# Patient Record
Sex: Male | Born: 1990 | Race: White | Hispanic: No | Marital: Single | Smoking: Never smoker
Health system: Southern US, Community
[De-identification: ages and names within clinical notes are randomized; demographics above are authoritative.]

## PROBLEM LIST (undated history)

## (undated) DIAGNOSIS — F32A Depression, unspecified: Secondary | ICD-10-CM

## (undated) DIAGNOSIS — R319 Hematuria, unspecified: Secondary | ICD-10-CM

## (undated) DIAGNOSIS — F319 Bipolar disorder, unspecified: Secondary | ICD-10-CM

## (undated) DIAGNOSIS — Z960 Presence of urogenital implants: Secondary | ICD-10-CM

## (undated) DIAGNOSIS — Z87442 Personal history of urinary calculi: Secondary | ICD-10-CM

## (undated) DIAGNOSIS — F419 Anxiety disorder, unspecified: Secondary | ICD-10-CM

## (undated) DIAGNOSIS — R110 Nausea: Secondary | ICD-10-CM

## (undated) DIAGNOSIS — F909 Attention-deficit hyperactivity disorder, unspecified type: Secondary | ICD-10-CM

## (undated) DIAGNOSIS — R300 Dysuria: Secondary | ICD-10-CM

## (undated) DIAGNOSIS — F329 Major depressive disorder, single episode, unspecified: Secondary | ICD-10-CM

---
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
## 2013-12-26 DIAGNOSIS — Z87442 Personal history of urinary calculi: Secondary | ICD-10-CM

## 2013-12-26 HISTORY — DX: Personal history of urinary calculi: Z87.442

## 2014-12-14 ENCOUNTER — Emergency Department (HOSPITAL_COMMUNITY)
Admission: EM | Admit: 2014-12-14 | Discharge: 2014-12-14 | Disposition: A | Discharge status: Home / Self Care | Payer: Medicaid Other | Attending: Emergency Medicine | Admitting: Emergency Medicine

## 2014-12-14 ENCOUNTER — Encounter (HOSPITAL_COMMUNITY): Payer: Self-pay | Admitting: Emergency Medicine

## 2014-12-14 DIAGNOSIS — Z87442 Personal history of urinary calculi: Secondary | ICD-10-CM | POA: Insufficient documentation

## 2014-12-14 DIAGNOSIS — Z79899 Other long term (current) drug therapy: Secondary | ICD-10-CM | POA: Diagnosis not present

## 2014-12-14 DIAGNOSIS — R1012 Left upper quadrant pain: Secondary | ICD-10-CM | POA: Diagnosis present

## 2014-12-14 DIAGNOSIS — R319 Hematuria, unspecified: Secondary | ICD-10-CM | POA: Insufficient documentation

## 2014-12-14 LAB — URINALYSIS, ROUTINE W REFLEX MICROSCOPIC
BILIRUBIN URINE: NEGATIVE
Glucose, UA: NEGATIVE mg/dL
Ketones, ur: NEGATIVE mg/dL
Leukocytes, UA: NEGATIVE
Nitrite: NEGATIVE
Protein, ur: 30 mg/dL — AB
Specific Gravity, Urine: 1.025 (ref 1.005–1.030)
UROBILINOGEN UA: 0.2 mg/dL (ref 0.0–1.0)
pH: 6 (ref 5.0–8.0)

## 2014-12-14 LAB — URINE MICROSCOPIC-ADD ON

## 2014-12-14 MED ORDER — SODIUM CHLORIDE 0.9 % IV SOLN
Freq: Once | INTRAVENOUS | Status: AC
  Administered 2014-12-14: 11:00:00 via INTRAVENOUS

## 2014-12-14 MED ORDER — ONDANSETRON 4 MG PO TBDP: 4.0000 mg | ORAL_TABLET | Freq: Three times a day (TID) | ORAL | Status: DC | PRN

## 2014-12-14 MED ORDER — ONDANSETRON HCL 4 MG/2ML IJ SOLN
4.0000 mg | Freq: Once | INTRAMUSCULAR | Status: AC
  Administered 2014-12-14: 4 mg via INTRAVENOUS
  Filled 2014-12-14: qty 2

## 2014-12-14 MED ORDER — OXYCODONE-ACETAMINOPHEN 5-325 MG PO TABS: 2.0000 | ORAL_TABLET | ORAL | Status: DC | PRN

## 2014-12-14 MED ORDER — HYDROMORPHONE HCL 1 MG/ML IJ SOLN
1.0000 mg | Freq: Once | INTRAMUSCULAR | Status: AC
  Administered 2014-12-14: 1 mg via INTRAVENOUS
  Filled 2014-12-14: qty 1

## 2014-12-14 NOTE — ED Notes (Signed)
Took Hydrocodone 7.5mg  at 0830 this morning [facility] helped with pain very little.

## 2014-12-14 NOTE — ED Notes (Signed)
I gave the patient a cup of ice water per nurse Michelle. 

## 2014-12-14 NOTE — ED Notes (Signed)
Two days ago urinating blood, [facility] Told has a 9mm kidney stone that may not ever move. Last night started having pain in lower left abdomen. Denies vomiting. Oral mucosa very dry, reports he ate and drank normally until this morning, but has not had anything today due to feeling nauseated and the pain.

## 2014-12-14 NOTE — ED Notes (Signed)
Urinal given

## 2014-12-14 NOTE — Discharge Instructions (Signed)

## 2014-12-14 NOTE — ED Provider Notes (Signed)
CSN: [id number]     Arrival date & time 12/14/14  [id number] History   First MD Initiated Contact with Patient 12/14/14 [phone]     Chief Complaint  Patient presents with  . Flank Pain     (Consider location/radiation/quality/duration/timing/severity/associated sxs/prior Treatment) Patient is a 23 y.o. male presenting with abdominal pain. The history is provided by the patient. No language interpreter was used.  Abdominal Pain Pain location:  L flank Pain quality: aching and stabbing   Pain radiates to:  LUQ Pain severity:  Moderate Onset quality:  Gradual Duration:  1 day Timing:  Constant Progression:  Worsening Chronicity:  New Context: not recent illness   Relieved by:  Nothing Worsened by:  Nothing tried Ineffective treatments:  None tried Associated symptoms: nausea   Associated symptoms: no cough    Pt reports he was seen [facility] days ago and diagnosed with a kidney stone.  Pt reports he was told that he has a 9mm stone in his kidney. Pt reports he felt sick again this morning.  Pt has not ate or drnak anything today History reviewed. No pertinent past medical history. History reviewed. No pertinent past surgical history. History reviewed. No pertinent family history. History  Substance Use Topics  . Smoking status: Never Smoker   . Smokeless tobacco: Not on file  . Alcohol Use: No    Review of Systems  Respiratory: Negative for cough.   Gastrointestinal: Positive for nausea and abdominal pain.  All other systems reviewed and are negative.     Allergies  Review of patient's allergies indicates no known allergies.  Home Medications   Prior to Admission medications   Medication Sig Start Date End Date Taking? Authorizing Provider  busPIRone (BUSPAR) 15 MG tablet Take 15 mg by mouth 2 (two) times daily. 11/22/14  Yes Historical Provider, MD  citalopram (CELEXA) 20 MG tablet Take 20 mg by mouth daily. 11/27/14  Yes Historical Provider, MD  FOCALIN XR 5  MG 24 hr capsule Take 5 mg by mouth daily. 10/25/14  Yes Historical Provider, MD  lamoTRIgine (LAMICTAL) 100 MG tablet Take 100 mg by mouth at bedtime. 09/07/14  Yes Historical Provider, MD   BP 106/68 mmHg  Pulse 97  Temp(Src) 97.8 F (36.6 C) (Oral)  Resp 16  Ht 5\' 8"  (1.727 m)  Wt 110 lb (49.896 kg)  BMI 16.73 kg/m2  SpO2 100% Physical Exam  Constitutional: He is oriented to person, place, and time. He appears well-developed and well-nourished.  HENT:  Head: Normocephalic.  Eyes: EOM are normal. Pupils are equal, round, and reactive to light.  Neck: Normal range of motion.  Cardiovascular: Normal rate and normal heart sounds.   Pulmonary/Chest: Effort normal.  Abdominal: Soft. He exhibits no distension.  Musculoskeletal: Normal range of motion.  Neurological: He is alert and oriented to person, place, and time.  Skin: Skin is warm.  Psychiatric: He has a normal mood and affect.  Nursing note and vitals reviewed.   ED Course  Procedures (including critical care time) Labs Review Labs Reviewed  URINALYSIS, ROUTINE W REFLEX MICROSCOPIC - Abnormal; Notable for the following:    Color, Urine AMBER (*)    APPearance TURBID (*)    Hgb urine dipstick LARGE (*)    Protein, ur 30 (*)    All other components within normal limits  URINE MICROSCOPIC-ADD ON - Abnormal; Notable for the following:    Squamous Epithelial / LPF FEW (*)    Crystals CA OXALATE CRYSTALS (*)  All other components within normal limits    Imaging Review No results found.   EKG Interpretation None      MDM   Final diagnoses:  Hematuria    [facility]   Pt has a 9mm renal stone.  No obstruction no hydro.  Pt feels better with Iv fluids and pain medication.  Pt advised to schedule to see Urology for evaluation.  Pt given rx for percocet and zofran odt.   Lonia SkinnerLeslie K PopeSofia, PA-C 12/14/14 1241  Rolland PorterMark James, MD 12/15/14 [phone]

## 2014-12-15 ENCOUNTER — Encounter (HOSPITAL_COMMUNITY)
Admission: RE | Disposition: A | Discharge status: Home / Self Care | Payer: Self-pay | Source: Ambulatory Visit | Attending: Urology

## 2014-12-15 ENCOUNTER — Encounter (HOSPITAL_COMMUNITY): Payer: Self-pay | Admitting: *Deleted

## 2014-12-15 ENCOUNTER — Ambulatory Visit (HOSPITAL_COMMUNITY): Payer: Medicaid Other

## 2014-12-15 ENCOUNTER — Ambulatory Visit (HOSPITAL_COMMUNITY)
Admission: RE | Admit: 2014-12-15 | Discharge: 2014-12-15 | Disposition: A | Discharge status: Home / Self Care | Payer: Medicaid Other | Source: Ambulatory Visit | Attending: Urology | Admitting: Urology

## 2014-12-15 ENCOUNTER — Ambulatory Visit: Admit: 2014-12-15 | Payer: Self-pay | Admitting: Urology

## 2014-12-15 ENCOUNTER — Other Ambulatory Visit: Payer: Self-pay | Admitting: Urology

## 2014-12-15 ENCOUNTER — Ambulatory Visit (HOSPITAL_COMMUNITY): Payer: Medicaid Other | Admitting: Anesthesiology

## 2014-12-15 DIAGNOSIS — R509 Fever, unspecified: Secondary | ICD-10-CM | POA: Insufficient documentation

## 2014-12-15 DIAGNOSIS — F319 Bipolar disorder, unspecified: Secondary | ICD-10-CM | POA: Diagnosis not present

## 2014-12-15 DIAGNOSIS — N201 Calculus of ureter: Secondary | ICD-10-CM | POA: Insufficient documentation

## 2014-12-15 DIAGNOSIS — Z841 Family history of disorders of kidney and ureter: Secondary | ICD-10-CM | POA: Diagnosis not present

## 2014-12-15 DIAGNOSIS — N202 Calculus of kidney with calculus of ureter: Secondary | ICD-10-CM | POA: Diagnosis present

## 2014-12-15 DIAGNOSIS — N200 Calculus of kidney: Secondary | ICD-10-CM

## 2014-12-15 HISTORY — DX: Major depressive disorder, single episode, unspecified: F32.9

## 2014-12-15 HISTORY — DX: Depression, unspecified: F32.A

## 2014-12-15 HISTORY — PX: CYSTOSCOPY W/ URETERAL STENT PLACEMENT: SHX1429

## 2014-12-15 HISTORY — DX: Bipolar disorder, unspecified: F31.9

## 2014-12-15 HISTORY — PX: CYSTOSCOPY WITH RETROGRADE PYELOGRAM, URETEROSCOPY AND STENT PLACEMENT: SHX5789

## 2014-12-15 HISTORY — DX: Anxiety disorder, unspecified: F41.9

## 2014-12-15 HISTORY — DX: Personal history of urinary calculi: Z87.442

## 2014-12-15 SURGERY — CYSTOSCOPY, WITH STENT INSERTION
Anesthesia: Choice | Laterality: Left

## 2014-12-15 SURGERY — LITHOTRIPSY, ESWL
Anesthesia: LOCAL

## 2014-12-15 SURGERY — CYSTOURETEROSCOPY, WITH RETROGRADE PYELOGRAM AND STENT INSERTION
Anesthesia: General | Site: Ureter | Laterality: Left

## 2014-12-15 MED ORDER — FENTANYL CITRATE 0.05 MG/ML IJ SOLN: 25.0000 ug | INTRAMUSCULAR | Status: DC | PRN

## 2014-12-15 MED ORDER — OXYCODONE-ACETAMINOPHEN 5-325 MG PO TABS: 1.0000 | ORAL_TABLET | ORAL | Status: DC | PRN

## 2014-12-15 MED ORDER — DIAZEPAM 5 MG PO TABS
10.0000 mg | ORAL_TABLET | ORAL | Status: AC
  Administered 2014-12-15: 10 mg via ORAL
  Filled 2014-12-15: qty 2

## 2014-12-15 MED ORDER — ONDANSETRON HCL 4 MG/2ML IJ SOLN
INTRAMUSCULAR | Status: AC
  Filled 2014-12-15: qty 2

## 2014-12-15 MED ORDER — LIDOCAINE HCL 2 % EX GEL
CUTANEOUS | Status: DC | PRN
  Administered 2014-12-15: 1 via URETHRAL

## 2014-12-15 MED ORDER — DEXAMETHASONE SODIUM PHOSPHATE 10 MG/ML IJ SOLN
INTRAMUSCULAR | Status: AC
  Filled 2014-12-15: qty 1

## 2014-12-15 MED ORDER — DIPHENHYDRAMINE HCL 25 MG PO CAPS
25.0000 mg | ORAL_CAPSULE | ORAL | Status: AC
  Administered 2014-12-15: 25 mg via ORAL
  Filled 2014-12-15: qty 1

## 2014-12-15 MED ORDER — METOCLOPRAMIDE HCL 5 MG/ML IJ SOLN
INTRAMUSCULAR | Status: DC | PRN
  Administered 2014-12-15: 10 mg via INTRAVENOUS

## 2014-12-15 MED ORDER — METOCLOPRAMIDE HCL 5 MG/ML IJ SOLN
INTRAMUSCULAR | Status: AC
  Filled 2014-12-15: qty 2

## 2014-12-15 MED ORDER — LIDOCAINE HCL 2 % EX GEL
CUTANEOUS | Status: AC
  Filled 2014-12-15: qty 10

## 2014-12-15 MED ORDER — DEXAMETHASONE SODIUM PHOSPHATE 10 MG/ML IJ SOLN
INTRAMUSCULAR | Status: DC | PRN
  Administered 2014-12-15: 10 mg via INTRAVENOUS

## 2014-12-15 MED ORDER — BELLADONNA ALKALOIDS-OPIUM 16.2-60 MG RE SUPP
RECTAL | Status: DC | PRN
  Administered 2014-12-15: 1 via RECTAL

## 2014-12-15 MED ORDER — CIPROFLOXACIN HCL 500 MG PO TABS
500.0000 mg | ORAL_TABLET | Freq: Two times a day (BID) | ORAL | Status: DC
Start: 2014-12-15 — End: 2017-01-05

## 2014-12-15 MED ORDER — KETOROLAC TROMETHAMINE 30 MG/ML IJ SOLN
INTRAMUSCULAR | Status: AC
  Filled 2014-12-15: qty 1

## 2014-12-15 MED ORDER — KETOROLAC TROMETHAMINE 30 MG/ML IJ SOLN
INTRAMUSCULAR | Status: DC | PRN
  Administered 2014-12-15: 30 mg via INTRAVENOUS

## 2014-12-15 MED ORDER — FENTANYL CITRATE 0.05 MG/ML IJ SOLN
INTRAMUSCULAR | Status: DC | PRN
  Administered 2014-12-15 (×2): 50 ug via INTRAVENOUS

## 2014-12-15 MED ORDER — DEXTROSE-NACL 5-0.45 % IV SOLN
INTRAVENOUS | Status: DC
  Administered 2014-12-15: 16:00:00 via INTRAVENOUS

## 2014-12-15 MED ORDER — MIDAZOLAM HCL 5 MG/5ML IJ SOLN
INTRAMUSCULAR | Status: DC | PRN
  Administered 2014-12-15: 2 mg via INTRAVENOUS

## 2014-12-15 MED ORDER — PROPOFOL 10 MG/ML IV BOLUS
INTRAVENOUS | Status: AC
  Filled 2014-12-15: qty 20

## 2014-12-15 MED ORDER — ONDANSETRON HCL 4 MG/2ML IJ SOLN
INTRAMUSCULAR | Status: DC | PRN
  Administered 2014-12-15: 4 mg via INTRAVENOUS

## 2014-12-15 MED ORDER — BELLADONNA ALKALOIDS-OPIUM 16.2-60 MG RE SUPP
RECTAL | Status: AC
  Filled 2014-12-15: qty 1

## 2014-12-15 MED ORDER — PROPOFOL 10 MG/ML IV BOLUS
INTRAVENOUS | Status: DC | PRN
  Administered 2014-12-15: 150 mg via INTRAVENOUS

## 2014-12-15 MED ORDER — FENTANYL CITRATE 0.05 MG/ML IJ SOLN
INTRAMUSCULAR | Status: AC
  Filled 2014-12-15: qty 2

## 2014-12-15 MED ORDER — STERILE WATER FOR IRRIGATION IR SOLN
Status: DC | PRN
  Administered 2014-12-15: 3000 mL

## 2014-12-15 MED ORDER — LACTATED RINGERS IV SOLN: INTRAVENOUS | Status: DC

## 2014-12-15 MED ORDER — MIDAZOLAM HCL 2 MG/2ML IJ SOLN
INTRAMUSCULAR | Status: AC
  Filled 2014-12-15: qty 2

## 2014-12-15 MED ORDER — PROMETHAZINE HCL 25 MG/ML IJ SOLN: 6.2500 mg | INTRAMUSCULAR | Status: DC | PRN

## 2014-12-15 MED ORDER — ONDANSETRON HCL 4 MG/2ML IJ SOLN
INTRAMUSCULAR | Status: AC
  Filled 2014-12-15: qty 4

## 2014-12-15 MED ORDER — PHENAZOPYRIDINE HCL 200 MG PO TABS: 200.0000 mg | ORAL_TABLET | Freq: Three times a day (TID) | ORAL | Status: DC | PRN

## 2014-12-15 MED ORDER — CIPROFLOXACIN HCL 500 MG PO TABS
500.0000 mg | ORAL_TABLET | ORAL | Status: AC
  Administered 2014-12-15: 500 mg via ORAL
  Filled 2014-12-15: qty 1

## 2014-12-15 MED ORDER — LACTATED RINGERS IV SOLN
INTRAVENOUS | Status: DC | PRN
  Administered 2014-12-15: 21:00:00 via INTRAVENOUS

## 2014-12-15 SURGICAL SUPPLY — 11 items
BAG URO CATCHER STRL LF (DRAPE) ×3 IMPLANT
CATH URET 5FR 28IN OPEN ENDED (CATHETERS) IMPLANT
CLOTH BEACON ORANGE TIMEOUT ST (SAFETY) ×3 IMPLANT
DRAPE CAMERA CLOSED 9X96 (DRAPES) ×3 IMPLANT
GLOVE SURG SS PI 8.0 STRL IVOR (GLOVE) IMPLANT
GOWN STRL REUS W/TWL XL LVL3 (GOWN DISPOSABLE) ×3 IMPLANT
MANIFOLD NEPTUNE II (INSTRUMENTS) ×3 IMPLANT
PACK CYSTO (CUSTOM PROCEDURE TRAY) ×3 IMPLANT
STENT PERCUFLEX 4.8FRX26 (STENTS) ×3 IMPLANT
TUBING CONNECTING 10 (TUBING) ×2 IMPLANT
TUBING CONNECTING 10' (TUBING) ×1

## 2014-12-15 NOTE — Brief Op Note (Signed)
12/15/2014  9:15 PM  PATIENT:  Douglas Bailey  23 y.o. male  PRE-OPERATIVE DIAGNOSIS:  LEFT PROXIMAL URETREAL STONE  POST-OPERATIVE DIAGNOSIS:  left proximal ureteral stone  PROCEDURE:  Procedure(s): CYSTOSCOPY STENT PLACEMENT left ureter (Left)  SURGEON:  Surgeon(s) and Role:    * Anner CreteJohn J Donne Baley, MD - Primary  PHYSICIAN ASSISTANT:   ASSISTANTS: none   ANESTHESIA:   general  EBL:     BLOOD ADMINISTERED:none  DRAINS: 4.8 x 3126fr JJ stent   LOCAL MEDICATIONS USED:  LIDOCAINE  and Amount: 10 ml jelly  SPECIMEN:  No Specimen  DISPOSITION OF SPECIMEN:  N/A  COUNTS:  YES  TOURNIQUET:  * No tourniquets in log *  DICTATION: .Other Dictation: Dictation Number [id number]  PLAN OF CARE: Discharge to home after PACU  PATIENT DISPOSITION:  PACU - hemodynamically stable.   Delay start of Pharmacological VTE agent (>24hrs) due to surgical blood loss or risk of bleeding: not applicable

## 2014-12-15 NOTE — H&P (Signed)
Active Problems Problems  1. Renal calculus, left (N20.0)  History of Present Illness Douglas Bailey is a 23 yo WM who is sent with a 9mm left renal stone.  He was seen in the [facility] 2 days ago for left flank pain. The CT showed a 9mm renal pelvic stone without obstruction. The pain abated with medication but he is hurting again. He has had no prior stones. He has had no GU surgery or UTIs.  He has had gross hematuria and some straining to void. The pain is severe.  His stone was 700+ HU on CT but he has uric acid crystals in the urine today.   Past Medical History Problems  1. History of Anxiety (F41.9) 2. History of Bipolar disorder (F31.9) 3. History of depression (Z86.59)  Surgical History Problems  1. History of No Surgical Problems  Current Meds 1. BusPIRone HCl - 15 MG Oral Tablet;  Therapy: (Recorded:21Dec2015) to Recorded 2. Citalopram Hydrobromide 20 MG Oral Tablet;  Therapy: (Recorded:21Dec2015) to Recorded 3. LaMICtal 100 MG Oral Tablet;  Therapy: (Recorded:21Dec2015) to Recorded 4. Ondansetron 4 MG Oral Tablet Dispersible;  Therapy: (Recorded:21Dec2015) to Recorded 5. Oxycodone-Acetaminophen 5-325 MG Oral Tablet;  Therapy: (Recorded:21Dec2015) to Recorded  Allergies Medication  1. No Known Drug Allergies  Family History Problems  1. Family history of kidney stones (Z84.1) : Mother, Father 2. Family history of urinary tract infection (Z84.2) : Mother  Social History Problems    Denied: History of Alcohol use   Never a smoker   Occupation   not employed   Single  Review of Systems Genitourinary, constitutional, skin, eye, otolaryngeal, hematologic/lymphatic, cardiovascular, pulmonary, endocrine, musculoskeletal, gastrointestinal, neurological and psychiatric system(s) were reviewed and pertinent findings if present are noted and are otherwise negative.  Genitourinary: urinary frequency, dysuria, hematuria and initiating urination requires straining.   Gastrointestinal: nausea and flank pain.  Constitutional: feeling tired (fatigue).  Psychiatric: anxiety and depression.    Vitals Vital Signs [Data Includes: Last 1 Day]  Recorded: 21Dec2015 12:25PM  Height: 5 ft 8 in Weight: 110 lb  BMI Calculated: 16.73 BSA Calculated: 1.59 Blood Pressure: 107 / 72 Temperature: 97.8 F Heart Rate: 75  Physical Exam Constitutional: Well nourished and well developed . In acute distress. With pain.  ENT:. The ears and nose are normal in appearance. 2 lip rings.  Neck: The appearance of the neck is normal and no neck mass is present.  Pulmonary: No respiratory distress and normal respiratory rhythm and effort.  Cardiovascular: Heart rate and rhythm are normal . No peripheral edema.  Abdomen: No masses are palpated. Severe tenderness in the LLQ is present. severe left CVA tenderness. No hernias are palpable. No hepatosplenomegaly noted.  Lymphatics: The posterior cervical and supraclavicular nodes are not enlarged or tender.  Skin: Normal skin turgor, no visible rash and no visible skin lesions.  Neuro/Psych:. Mood and affect are appropriate.    Results/Data Urine [Data Includes: Last 1 Day]   21Dec2015  COLOR AMBER   APPEARANCE CLOUDY   SPECIFIC GRAVITY 1.020   pH 6.0   GLUCOSE NEG mg/dL  BILIRUBIN NEG   KETONE NEG mg/dL  BLOOD LARGE   PROTEIN 30 mg/dL  UROBILINOGEN 0.2 mg/dL  NITRITE NEG   LEUKOCYTE ESTERASE NEG   SQUAMOUS EPITHELIAL/HPF NONE SEEN   WBC NONE SEEN WBC/hpf  RBC TNTC RBC/hpf  BACTERIA NONE SEEN   CRYSTALS Uric Acid crystals noted   CASTS NONE SEEN    Old records or history reviewed: I have reviewed the ER   records.  The following images/tracing/specimen were independentlyVa Medical Center - Batavi65[facility] GrapeviOZH:YLuciana[place] KoreSaAurea [id number] - Harlingen9Sterlington Rehabilitation HospitalHarlLadon30iaLester C639Id[facility] P23Naples Eye Surgery CentOZH:YLucianaK[place] KoreSaAurea [id number]9Proliance Highlands Surgery CenterHarlAnders49onLester CChristus St. Frances Cabrini Hospita25Grande Ronde HospitOZH:YLuciana[place] KoreSaAurea Gr2846082Va [facility]. [facility]9Wiregrass Medical CenterHarlVandeme18reLester C339Obie DreCedar Park Surgery CeFort Myers Surgery Cente20Advanced Eye Surgery Center OZH:YLucianaK[place] KoreSaAurea [id number] [place]9Dearborn Surgery Center LLC Dba Dearborn Surgery CenterHarlSigourn6eyLester CCarolina Mountain Gastroenterology Endoscopy Center LL43Ridge Lake Asc LOZH:YLucianaKent[place] KoreSaAurea Gr2842282The Medical Center At Caverna9Blueridge Vista Health And WellnessHarlUpper Bear Cre64ekLesAdventist Health Simi Valle71St Joseph Health CentOZH:YLucianaK[place] KoreSaAurea Gr284282Chi Health St. Elizabeth9Thomas HospitalHarlVero Beach Sou38thEye Surgicenter Of New Jerse77Landmark Hospital Of Southwest FloriOZH:YLucianaK[place] KoreSaAurea Gr2843282Northern Michigan Surgical Suites9Watsonville Community HospitalHarlJunction COroville Hospita71Royal Oaks HospitOZH:YLucianaKent[place] KoreSaAurea Gr2848682North[facility]9Laser Surgery CtrHarlGran20byLester C853Obie DChristus Dubuis Hospital Of Beaumon72Great River Medical CentOZH:YLucianaK[place] KoreSaAurea Gr284382[facility] [place]9Select Specialty Hospital - [facility] BoardmanHarlLake Providen77ceLester C66Northwest Spine And Laser Surgery Center LL49Wellspan Ephrata Community HospitOZH:YLuciana[place] KoreSaAurea Gr284282Centra Lynch[facility] Hospital9Pioneer Memorial HospitalHarlNew Lla47noLKindred Hospital Bay Are73Lawnwood Pavilion - Psychiatric HospitOZH:YLucianaKent[place] KoreSaAurea Gr2842082Wilming[facility] Va [facility] Center9South Austin Surgicenter LLCHarlAllenda76leLester Northern Light A R Gould Hospita64Wayne Memorial HospitOZH:YLucianaK[place] KoreSaAurea Gr2849682Silver Lake Medical Center-Ingleside Campus9Providence Surgery CenterHarlLake Ci3tyLester C56Department Of Veterans Affairs Medical Cente26Hosp San Carlos BorromOZH:YLuciana[place] KoreSaAurea Gr2846582C[facility]9Surgery Center Of Chesapeake LLCHarlParadi25seLester C33Obie DreRiverside Behavioral Health KentuckyeCoConnecticut Childbirth & Women'S Cente66Surgery Center At St Vincent LLC Dba East Pavilion Surgery CentOZH:YLuciana[place] KoreSaAurea Gr2843582Nyu Hospitals Center9Kindred Hospital Sugar LandHarlHolden Bea11chLester C395Obie Mcleod Medical Center-Darlingto70Kindred Hospital Baldwin PaOZH:YLucianaK[place] KoreSaAurea Gr2842482Kansas Spine Hospital LLC9Encompass Health Rehabilitation Hospital Of AbileneHarlLittle Eag74leRoseland Community Hospita65Orlando Outpatient Surgery CentOZH:YLuciana[place] KoreSaAurea Gr2845882Divine Savior Hlthcare9Missouri Baptist Hospital Of SullivanHarlSal8ixLester C457ObGlobal Microsurgical Center LL51United Hospital CentOZH:YLucianaKent[place] KoreSaAurea Gr284182Uchealth Grandview Hospital9Millinocket Regional HospitalHarlCavali66erLester C6Washington Gastroenterolog2Wamego Health CentOZH:YLucianaKent[place] KoreSaAurea Gr2842182Kindred [facility] - [place]9Hillside Endoscopy Center LLCHarlPowers La38keLester CQuad City Ambulatory Surgery Center LL20Nhpe LLC Dba New [place] Park EndoscoOZH:YLuciana[place] KoreSaAurea Gr2844382Cleburne Surgical Center LLP9Shannon Medical Center St Johns CampusHarlTenna10ntLesteChesterton Surgery Center LL46Endoscopy Center Of Pennsylania HospitOZH:YLuciana[place] KoreSaAurea Gr2846882St. Mary'S [facility] Center9Adventhealth HendersonvilleHarlBelvide11reLesterCheyenne Regional Medical Cente35Kindred Hospital El PaOZH:YLuciana[place] KoreSaAurea Gr2847082Dupage Eye Surgery Center LLC9Wasatch Front Surgery Center LLCHarlParco21alLester C3695OBaylor Scott White Surgicare Plan5Oaklawn Psychiatric Center IOZH:YLuciana[place] KoreSaAurea Gr2848482Bayfront Health Port Charlotte9Indiana Regional Medical CenterHarlNels73onLester C3214Obie DreColumbia GastrointestiNorth Valley Hospita31Avoyelles HospitOZH:YLucianaK[place] KoreSaAurea Gr2844382Riverlakes Surgery Center LLC9Lovelace Westside HospitalHarlOnyc62haLester C4769Obie DreSurprise Valley Community HoKentuckyspMontgoEmory Healthcar69Southern Maryland Endoscopy Center LOZH:YLucianaK[place] KoreSaAurea Gr2843382Methodist Healthcare - Fayette Hospital9Chi Health LakesideHarlBrush Prair55ieLester C43Obie DreRestpadd PsychiKaiser Fnd Hosp - Santa Ros73Community Health Network Rehabilitation HospitOZH:YLucianaK[place] KoreSaAurea Gr2843282Skyline Surgery Center9Pembina County Memorial HospitalHarlDowni64ngLeKansas Heart Hospita74Candler County HospitOZH:YLucianaK[place] KoreSaAurea Gr2842882St. Vincent'S Blount9St Joseph Medical Center-MainHarlMokelumne Hi50llLester C6648Obie DreWindsor Laurelwood Center For Behavorial MeKentuckydiMaine Eye Care AsMistOZH28482Cornerstone Hospital Of BossierLutheran Campus As14Resurgens East Surgery Center LOZH:YLuciana[place] KoreSaAurea Gr2843682Unity Medical Center9Piedmont Geriatric HospitalHarlEd55enLester C1285OAdvanced Urology Surgery Cente85West Bloomfield Surgery Center LLC Dba Lakes Surgery CentOZH:YLucianaK[place] KoreSaAurea Gr2843282Medstar Franklin Square Medical Center9Pershing General HospitalHarlNorth Lindenhur71stLester C455Obie DreEndoscopic Procedure Center LL64West Creek Surgery CentOZH:YLucianaK[place] KoreSaAurea Gr2845382Com[facility]9San Antonio Gastroenterology Endoscopy Center Med CenterHarlSahuari52taLester C1571Obie DreSsm HeaWny Medical Management LL23East Memphis Urology Center Dba UrocentOZH:YLucianaK[place] KoreSaAurea Gr2844382Suburban Hospital9Seabrook HouseHarlCanyon Cre78ekLester C9100Obie DreMKindred Hospital - Tarrant Count80The Reading Hospital Surgicenter At Spring Ridge LOZH:YLuciana[place] KoreSaAurea Gr2846782Haxtun Hospital District9Hosp San Carlos BorromeoHarlFriendsvil38leLester C3856Obie Buford Eye Surgery Cente85East Georgia Regional Medical CentOZH:YLucianaK[place] KoreSaAurea Gr2845582Henry Ford Allegiance Specialty [facility]9Maricopa Medical CenterHarlSanta Cla77raLester C72Obie DreDignity Health St. Rose DominBanner Del E. Webb Medical Cente39Mark Fromer LLC Dba Eye Surgery Centers Of New YoOZH:YLucianaK[place] KoreSaAurea Gr2848782Summitridge Center- Psychiatry & Addictive Med9Barnes-Jewish Hospital - NorthHarlTalihi42naLester C3711Obie DreSt CatherDelta Regional Medical Center - West CampuAdventhealth Rollins Brook Community HospitOZH:YLucianaK[place] KoreSaAurea Gr2848282Stephens [facility]9Endoscopy Center Of South Jersey P CHarlKoyuk37ukLester C5481Obie DreEliTaylor Regional Hospita42Advent Health Dade CiOZH:YLucianaK[place] KoreSaAurea Gr284482Curahealth Pittsburgh9Byrd Regional HospitalHarlSolva80ngLester C450Obie DreVanderbilt StallwUtah Surgery Center L75Olean General HospitOZH:YLucianaK[place] KoreSaAurea Gr2842282Carolinas Endoscopy Center University9Weslaco Rehabilitation HospitalHarlGalevil52leLester C1584Obie DreMetrFaulkton Area Medical Cente5Complex Care Hospital At RidgelaOZH:YLucianaK[place] KoreSaAurea Gr284582Keokuk Area Hospital9Prague Community HospitalHarlConcreBurke Medical Cente51Sierra Endoscopy CentOZH:YLucianaK[place] KoreSaAurea Gr2842482Community Hospital Of San Bernardino9Northern Hospital Of Surry CountyHarlOpa-loc100kaLester C8355ObiVantage Surgery Center L6Columbus Community HospitOZH:YLucianaK[place] KoreSaAurea Gr2845182University Endoscopy Center9Huntingdon Valley Surgery CenterHarlSide64llLester C6693Surgical Center For Urology LL66Ssm Health St Marys Janesville HospitOZH:YLucianaKent[place] KoreSaAurea Gr28482Banner Phoenix Surgery Center LLC9Cartersville Medical CenterHarlRuff35inLester C4436Obie DreIndiana UniverWest Feliciana Parish Hospita18Mission Oaks HospitOZH:YLucianaKent[place] KoreSaAurea Gr2844782Community Memorial Hospital-San Buenaventura9Essentia Health SandstoneHarlWhite Clo83udLester South Central Regional Medical Cente19Harper Hospital District NoOZH:YLucianaKentucky KoreSaAurea Gr2846582Zion Eye Institute Inc9Novant Health Rehabilitation HospitalHarlFlorida Rid48geLester C496Obie DrNorthern Virginia Eye Surgery Center LL5Yale-New Haven HospitOZH:YLucianaKentucky KoreSaAurea Gr28482Spine Sports Surgery Center LLC9The Ent Center Of Rhode Island LLCHarlShorewo87odLester C848OThe Rehabilitation Institute Of St. Loui58Liberty Ambulatory Surgery Center LOZH:YLucianaKent[place] KoreSaAurea Gr2846882Fredericksburg Ambulatory Surgery Center LLC9Oregon Surgical InstituteHarlThree For55ksLester Nocona General Hospita65Northern Westchester HospitOZH:YLucianaKentucky KoreSaAurea Gr2844382Pacific Surgery Center9River Crest HospitalHarlValer48iaLesteHill Hospital Of Sumter Count64The Center For Digestive And Liver Health And The Endoscopy CentOZH:YLucianaKent[place] KoreSaAurea Gr2846282St Vincent Dunn [facility] Inc9Four Corners Ambulatory Surgery Center LLCHarlHarr29isLester C362Obie DreThe Rehabilitation InstituRiverview Regional Medical Cente27Newport Beach Orange Coast EndoscoOZH:YLucianaK[place] KoreSaAurea Gr2847782Premier Health Associates LLC9Eye Care Surgery Center SouthavenHarlWoodland Hil77lsLester C433ObOregon Surgicenter LL84Guam Regional Medical CiOZH:YLucianaKentucky KoreSaAurea Gr2847582Tri City Orthopaedic Clinic Psc9Naval Branch Health Clinic BangorHarlWiggi11nsLester C419Obie Cataract Ctr Of East T61Chambersburg HospitOZH:YLucianaKentucky KoreSaAurea Gr2842282Penn Presbyterian [facility] Center9Christus Spohn Hospital BeevilleHarlSanta Nel91laLester C6189Obie DreGLakeland Surgical And Diagnostic Center LLP Griffin Campu33Creekwood Surgery Center OZH:YLucianaK[place] KoreSaAurea Gr2847282An[facility]9Silver Hill Hospital, Inc.HarlMari64onLesteHosp General Menonita - Aibonit55Surgery Center Of LawrencevilOZH:YLucianaKentucky KoreSaAurea Gr2845082[facility] Joplin9Northeast Rehab HospitalHarlKlama35thLester C694Obie DreBaylor Scott And White SpoPalmetto Surgery Center LL22Mahnomen Health CentOZH:YLucianaKentucky KoreSaAurea Gr2841182Navicent Health Baldwin9Shrewsbury Surgery CenterHarlOneka27maLester C74Obie DreMontgomery Roseburg Va Medical Cente24Plastic Surgical Center Of MississipOZH:YLucianaKentucky KoreSaAurea Gr2842282Community West[facility] Hospital9Providence St Vincent Medical CenterHarlEl La78goLestePresbyterian Hospital As34Cumberland River HospitOZH:YLucianaKentucky KoreSaAurea Gr2843882Kunesh Eye Surgery Center9Zion Eye Institute IncHarlDripping Sprin17gsMonroeville Ambulatory Surgery Center LL95Cumberland Hospital For Children And AdolescenOZH:YLucianaKent[place] KoreSaAurea Gr2844182Eastern La Mental Health System9Lasalle General HospitalHarlNeo88laLester C330Obie DIowa Endoscopy Cente47Hurley Medical CentOZH:YLucianaKentucky KoreSaAurea Gr2843782Spotsylvania Regional [facility] Center9Premier Asc LLCHarlBlountsvil109leLester C806ObieRush Copley Surgicenter LL65Harper County Community HospitOZH:YLucianaK[place] KoreSaAurea Gr2842682Kansas Medical Center LLC9Va Medical Center - DallasHarlWest College Corn26erLester C537Obi62eDoLuciana KoreaAAurea GrafHarlCampoLester C589Obie DredgDominican Hospital-Santa CruMisty [phone] KentuckyeH Lee Moffitt Cancer Ctr & ReseaMisty Stan17le2Gildardo nical lab reports were reviewed:  UA reviewed.     Assessment Assessed  1. Renal calculus, left (N20.0)  He has a left UPJ stone with severe pain that was relieved with morphine but the stone will not pass.   Plan Health Maintenance  1. UA With REFLEX; [Do Not Release]; Status:Resulted - Requires Verification;   Done:  21Dec2015 11:57AM Renal calculus, left  2. Administered: Morphine Sulfate 15 MG/ML Injection Solution 3. Administered: Promethazine HCl 25 MG/ML Injection Solution 4. Follow-up Schedule Surgery Office  Follow-up  Status: Hold For - Appointment   Requested for: 21Dec2015 5. KUB; Status:Resulted - Requires Verification;   Done: 21Dec2015 12:00AM  I discussed ESWL and ureteroscopy and will try to get him added on for an ESWL today. I reviewed the risks of bleeding, infection, need for secondary procedures, injury to the kidney or adjacent structures, skin changes, thrombotic events and sedation complications.   Discussion/Summary CC: [facility] in [place].

## 2014-12-15 NOTE — Discharge Instructions (Addendum)
Lithotripsy, Care After °Refer to this sheet in the next few weeks. These instructions provide you with information on caring for yourself after your procedure. Your health care provider may also give you more specific instructions. Your treatment has been planned according to current medical practices, but problems sometimes occur. Call your health care provider if you have any problems or questions after your procedure. °WHAT TO EXPECT AFTER THE PROCEDURE  °· Your urine may have a red tinge for a few days after treatment. Blood loss is usually minimal. °· You may have soreness in the back or flank area. This usually goes away after a few days. The procedure can cause blotches or bruises on the back where the pressure wave enters the skin. These marks usually cause only minimal discomfort and should disappear in a short time. °· Stone fragments should begin to pass within 24 hours of treatment. However, a delayed passage is not unusual. °· You may have pain, discomfort, and feel sick to your stomach (nauseated) when the crushed fragments of stone are passed down the tube from the kidney to the bladder. Stone fragments can pass soon after the procedure and may last for up to 4-8 weeks. °· A small number of patients may have severe pain when stone fragments are not able to pass, which leads to an obstruction. °· If your stone is greater than 1 inch (2.5 cm) in diameter or if you have multiple stones that have a combined diameter greater than 1 inch (2.5 cm), you may require more than one treatment. °· If you had a stent placed prior to your procedure, you may experience some discomfort, especially during urination. You may experience the pain or discomfort in your flank or back, or you may experience a sharp pain or discomfort at the base of your penis or in your lower abdomen. The discomfort usually lasts only a few minutes after urinating. °HOME CARE INSTRUCTIONS  °· Rest at home until you feel your energy  improving. °· Only take over-the-counter or prescription medicines for pain, discomfort, or fever as directed by your health care provider. Depending on the type of lithotripsy, you may need to take antibiotics and anti-inflammatory medicines for a few days. °· Drink enough water and fluids to keep your urine clear or pale yellow. This helps "flush" your kidneys. It helps pass any remaining pieces of stone and prevents stones from coming back. °· Most people can resume daily activities within 1-2 days after standard lithotripsy. It can take longer to recover from laser and percutaneous lithotripsy. °· If the stones are in your urinary system, you may be asked to strain your urine at home to look for stones. Any stones that are found can be sent to a medical lab for examination. °· Visit your health care provider for a follow-up appointment in a few weeks. Your doctor may remove your stent if you have one. Your health care provider will also check to see whether stone particles still remain. °SEEK MEDICAL CARE IF:  °· Your pain is not relieved by medicine. °· You have a lasting nauseous feeling. °· You feel there is too much blood in the urine. °· You develop persistent problems with frequent or painful urination that does not at least partially improve after 2 days following the procedure. °· You have a congested cough. °· You feel lightheaded. °· You develop a rash or any other signs that might suggest an allergic problem. °· You develop any reaction or side effects to   your medicine(s). SEEK IMMEDIATE MEDICAL CARE IF:   You experience severe back or flank pain or both.  You see nothing but blood when you urinate.  You cannot pass any urine at all.  You have a fever or shaking chills.  You develop shortness of breath, difficulty breathing, or chest pain.  You develop vomiting that will not stop after 6-8 hours.  You have a fainting episode. Document Released: 01/01/2008 Document Revised: 10/02/2013  Document Reviewed: 06/27/2013 [facility] Patient Information 2015 [place], [place]. This information is not intended to replace advice given to you by your health care provider. Make sure you discuss any questions you have with your health care provider. Ureteral Stent Implantation Ureteral stent implantation is the implantation of a soft plastic tube with multiple holes into the tube that drains urine from your kidney to your bladder (ureter). The stent helps drain your kidney when there is a blockage of the flow of urine in your ureter. The stent has a coil on each end to keep it from falling out. One end stays in the kidney. The other end stays in the bladder. It is most often taken out after any blockage has been removed or your ureter has healed. Short-term stents have a string attached to make removal quite easy. Removal of a short-term stent can be done in your health care provider's office or by you at home. Long-term stents need to be changed every few months. LET [facility] HospitalYOUR HEALTH CARE PROVIDER KNOW ABOUT:  Any allergies you have.  All medicines you are taking, including vitamins, herbs, eye drops, creams, and over-the-counter medicines.  Previous problems you or members of your family have had with the use of anesthetics.  Any blood disorders you have.  Previous surgeries you have had.  Medical conditions you have. RISKS AND COMPLICATIONS Generally, ureteral stent implantation is a safe procedure. However, as with any procedure, complications can occur. Possible complications include:  Movement of the stent away from where it was originally placed (migration). This may affect the ability of the stent to properly drain your kidney. If migration of the stent occurs, the stent may need to be replaced or repositioned.  Perforation of the ureter.  Infection. BEFORE THE PROCEDURE  You may be asked to wash your genital area with sterile soap the morning of your procedure.  You may be given  an oral antibiotic which you should take with a sip of water as prescribed by your health care provider.  You may be asked to not eat or drink for 8 hours before the surgery. PROCEDURE  First you will be given an anesthetic so you do not feel pain during the procedure.  Your health care provider will insert a special lighted instrument called a cystoscope into your bladder. This allows your health care provider to see the opening to your ureter.  A thin wire is carefully threaded into your bladder and up the ureter. The stent is inserted over the wire and the wire is then removed.  Your bladder will be emptied of urine. AFTER THE PROCEDURE You will be taken to a recovery room until it is okay for you to go home. Document Released: 12/09/2000 Document Revised: 12/17/2013 Document Reviewed: 05/21/2013 [facility] Patient Information 2015 [place], [place]. This information is not intended to replace advice given to you by your health care provider. Make sure you discuss any questions you have with your health care provider.

## 2014-12-15 NOTE — Progress Notes (Signed)
PACU Nursing Note: approx 2320hrs pt fully alert and oriented x 4, mae x 4, denied any pain or discomfort post operatively, denied any nausea, tolerating PO fluids well. VSS stable. Pt up to side of stretcher, IV saline locked, pt able to stand w/o assistance, ambulated to BR w/o assistance, gait very steady. Pt able to void w/o difficulty, denied any burning or discomfort during void. Voided 350ml of very light colored pink urine into urinal. Pt IV dc'd. DC instructions reviewed w/ patient and mother. Pt teaching done via teach back method and discussion with written material, opportunity for questions provided at several times. Pt and / or mother was able to repeat back important information given. Pt teaching done (1) importance of taking PO abx as prescribed, (2) safety while taking PO pain med and indications for pain med (3) appropriate post op diet as per anesthesia guidelines, (4) activity level for next 24 hours (5) times when to call MD on call , i.e unrelieved pain, nausea/ vomiting, unable to void, bloody urine, fever, etc. Copy of DC instructions given to mother of patient, mother reviewed with this RN again opportunity for questions provided. Two (2) Rx's given to mother and instructions again reviewed in re: abx and pain med. Pt able to dress self w/o assistance. Pt placed in wc and escorted to ED exit w/ family, prior to leaving for home, opportunity for questions and clarification about instructions again offered.   Gaetano NetM. Lilymarie Scroggins, BSN, RN, RRT, CPAN, CCRN

## 2014-12-15 NOTE — Transfer of Care (Signed)
Immediate Anesthesia Transfer of Care Note  Patient: Douglas LeedsSean Ide  Procedure(s) Performed: Procedure(s) (LRB): CYSTOSCOPY STENT PLACEMENT left ureter (Left)  Patient Location: PACU  Anesthesia Type: General  Level of Consciousness: sedated, patient cooperative and responds to stimulation  Airway & Oxygen Therapy: Patient Spontanous Breathing and Patient connected to face mask oxgen  Post-op Assessment: Report given to PACU RN and Post -op Vital signs reviewed and stable  Post vital signs: Reviewed and stable  Complications: No apparent anesthesia complications

## 2014-12-15 NOTE — Anesthesia Preprocedure Evaluation (Signed)
Anesthesia Evaluation  Patient identified by MRN, date of birth, ID band Patient awake    Reviewed: Allergy & Precautions, H&P , NPO status , Patient's Chart, lab work & pertinent test results  Airway Mallampati: II  TM Distance: >3 FB Neck ROM: Full   Comment: Piercing's lower lip Dental no notable dental hx.    Pulmonary neg pulmonary ROS,  breath sounds clear to auscultation  Pulmonary exam normal       Cardiovascular Exercise Tolerance: Good negative cardio ROS  Rhythm:Regular Rate:Normal     Neuro/Psych Anxiety Depression Bipolar Disorder negative neurological ROS     GI/Hepatic negative GI ROS, Neg liver ROS,   Endo/Other  negative endocrine ROS  Renal/GU negative Renal ROS  negative genitourinary   Musculoskeletal negative musculoskeletal ROS (+)   Abdominal   Peds negative pediatric ROS (+)  Hematology negative hematology ROS (+)   Anesthesia Other Findings   Reproductive/Obstetrics negative OB ROS                             Anesthesia Physical Anesthesia Plan  ASA: II and emergent  Anesthesia Plan: General   Post-op Pain Management:    Induction: Intravenous  Airway Management Planned: LMA  Additional Equipment:   Intra-op Plan:   Post-operative Plan: Extubation in OR  Informed Consent: I have reviewed the patients History and Physical, chart, labs and discussed the procedure including the risks, benefits and alternatives for the proposed anesthesia with the patient or authorized representative who has indicated his/her understanding and acceptance.   Dental advisory given  Plan Discussed with: CRNA  Anesthesia Plan Comments:         Anesthesia Quick Evaluation

## 2014-12-15 NOTE — Progress Notes (Signed)
Extracorporeal shock wave lithotripsy cancelled as patient had elevated temperature on lithotripsy unit. To go to surgery for stent placement.

## 2014-12-16 ENCOUNTER — Encounter (HOSPITAL_BASED_OUTPATIENT_CLINIC_OR_DEPARTMENT_OTHER): Payer: Self-pay | Admitting: *Deleted

## 2014-12-16 ENCOUNTER — Encounter (HOSPITAL_COMMUNITY): Payer: Self-pay

## 2014-12-16 ENCOUNTER — Other Ambulatory Visit: Payer: Self-pay | Admitting: Urology

## 2014-12-16 NOTE — Progress Notes (Signed)
Spoke w mom, hx verified. Instructions given for pt to be npo p mn 12/27.  [facility]  Needs hgb on arrival.

## 2014-12-16 NOTE — Anesthesia Postprocedure Evaluation (Signed)
  Anesthesia Post-op Note  Patient: Douglas LeedsSean Fordham  Procedure(s) Performed: Procedure(s) (LRB): CYSTOSCOPY STENT PLACEMENT left ureter (Left)  Patient Location: PACU  Anesthesia Type: General  Level of Consciousness: awake and alert   Airway and Oxygen Therapy: Patient Spontanous Breathing  Post-op Pain: mild  Post-op Assessment: Post-op Vital signs reviewed, Patient's Cardiovascular Status Stable, Respiratory Function Stable, Patent Airway and No signs of Nausea or vomiting  Last Vitals:  Filed Vitals:   12/15/14 2245  BP:   Pulse:   Temp: 36.8 C  Resp:     Post-op Vital Signs: stable   Complications: No apparent anesthesia complications

## 2014-12-16 NOTE — Op Note (Signed)
NAMAllison Quarry:  Jillson, Azell                  ACCOUNT NO.:  [id number]  MEDICAL RECORD NO.:  [id number]  LOCATION:  [facility]                         [facility]  PHYSICIAN:  Excell SeltzerJohn J. Annabell HowellsWrenn, M.D.    DATE OF BIRTH:  September 14, 1991  DATE OF PROCEDURE:  12/15/2014 DATE OF DISCHARGE:  12/15/2014                              OPERATIVE REPORT   PROCEDURE:  Cystoscopy with placement of left ureteral stent.  PREOPERATIVE DIAGNOSIS:  Left proximal ureteral stone with fever.  POSTOPERATIVE DIAGNOSIS:  Left proximal ureteral stone with fever.  SURGEON:  Excell SeltzerJohn J. Annabell HowellsWrenn, M.D.  ANESTHESIA:  General.  BLOOD LOSS:  None.  DRAINS:  A 4.8 x 26-cm double-J stent.  COMPLICATIONS:  None.  INDICATIONS:  [name] is a 23 year old white male, who presented to the office today after being seen in the emergency room for a 9-mm proximal stone.  He was medicated, but in pain.  Initially, it was felt that lithotripsy was indicated for this stone as it was visible, it did not appear too dense and it was in the proximal ureter.  However when he was taken to the lithotripsy truck despite being afebrile prior to arrival, he fell warm and then ER temperature was 101 degrees, this was confirmed to be above normal on subsequent repeats and it was felt that lithotripsy would not be safe in this situation.  He was felt to require stent for pain relief with lithotripsy of later date.  FINDINGS OF PROCEDURE:  He had been given an oral Cipro prior to the lithotripsy.  He was taken to the operating room where general anesthetic was induced.  He was placed in lithotomy position and fitted with PAS hose.  His perineum and genitalia were prepped with Betadine solution.  He was draped in usual sterile fashion.  Cystoscopy was performed using the 22-French scope and 12-degree lens. Examination revealed a normal urethra.  The external sphincter was intact.  The prostatic urethra was short without obstruction. Examination of the bladder  revealed smooth wall without tumor, stones, or inflammation.  The ureteral orifices were in the normal anatomic position.  The left ureteral orifice was cannulated with a sensor guidewire, which was passed to the kidney.  There was mild obstruction at the level of the stone, but it passed without too much pressure.  Once the wire was in place, he was noted to have efflux of turbid urine alongside the wire suggestive of pyonephrosis.  A 4.8-French 26-cm double-J stent without string was then passed over the wire to the kidney.  The wire was removed leaving good coil in the kidney and the bladder.  The bladder was drained.  The urethra was instilled with 2% lidocaine jelly 10 mL, and a B and O suppository was placed.  He was taken down from lithotomy position, moved to the recovery room in stable condition.  There were no complications.     Excell SeltzerJohn J. Annabell HowellsWrenn, M.D.     JJW/MEDQ  D:  12/15/2014  T:  12/16/2014  Job:  [id number]

## 2014-12-17 ENCOUNTER — Encounter (HOSPITAL_COMMUNITY): Payer: Self-pay | Admitting: Urology

## 2014-12-18 ENCOUNTER — Encounter (HOSPITAL_COMMUNITY)
Admission: RE | Disposition: A | Discharge status: Home / Self Care | Payer: Self-pay | Source: Ambulatory Visit | Attending: Urology

## 2014-12-18 ENCOUNTER — Ambulatory Visit (HOSPITAL_COMMUNITY)
Admission: RE | Admit: 2014-12-18 | Discharge: 2014-12-18 | Disposition: A | Discharge status: Home / Self Care | Payer: Medicaid Other | Source: Ambulatory Visit | Attending: Urology | Admitting: Urology

## 2014-12-18 ENCOUNTER — Encounter (HOSPITAL_COMMUNITY): Payer: Self-pay | Admitting: *Deleted

## 2014-12-18 ENCOUNTER — Ambulatory Visit (HOSPITAL_COMMUNITY): Payer: Medicaid Other

## 2014-12-18 DIAGNOSIS — N201 Calculus of ureter: Secondary | ICD-10-CM

## 2014-12-18 DIAGNOSIS — N200 Calculus of kidney: Secondary | ICD-10-CM | POA: Insufficient documentation

## 2014-12-18 HISTORY — DX: Hematuria, unspecified: R31.9

## 2014-12-18 HISTORY — DX: Dysuria: R30.0

## 2014-12-18 HISTORY — DX: Presence of urogenital implants: Z96.0

## 2014-12-18 HISTORY — DX: Nausea: R11.0

## 2014-12-18 SURGERY — LITHOTRIPSY, ESWL
Anesthesia: LOCAL | Laterality: Left

## 2014-12-18 MED ORDER — SODIUM CHLORIDE 0.9 % IJ SOLN: 3.0000 mL | Freq: Two times a day (BID) | INTRAMUSCULAR | Status: DC

## 2014-12-18 MED ORDER — CIPROFLOXACIN HCL 500 MG PO TABS
500.0000 mg | ORAL_TABLET | ORAL | Status: AC
  Administered 2014-12-18: 500 mg via ORAL
  Filled 2014-12-18: qty 1

## 2014-12-18 MED ORDER — FENTANYL CITRATE 0.05 MG/ML IJ SOLN: 25.0000 ug | INTRAMUSCULAR | Status: DC | PRN

## 2014-12-18 MED ORDER — DIAZEPAM 5 MG PO TABS
10.0000 mg | ORAL_TABLET | ORAL | Status: AC
  Administered 2014-12-18: 10 mg via ORAL
  Filled 2014-12-18: qty 2

## 2014-12-18 MED ORDER — ACETAMINOPHEN 325 MG PO TABS: 650.0000 mg | ORAL_TABLET | ORAL | Status: DC | PRN

## 2014-12-18 MED ORDER — SODIUM CHLORIDE 0.9 % IV SOLN: 250.0000 mL | INTRAVENOUS | Status: DC | PRN

## 2014-12-18 MED ORDER — ACETAMINOPHEN 650 MG RE SUPP
650.0000 mg | RECTAL | Status: DC | PRN
  Filled 2014-12-18: qty 1

## 2014-12-18 MED ORDER — OXYCODONE HCL 5 MG PO TABS: 5.0000 mg | ORAL_TABLET | ORAL | Status: DC | PRN

## 2014-12-18 MED ORDER — SODIUM CHLORIDE 0.9 % IJ SOLN: 3.0000 mL | INTRAMUSCULAR | Status: DC | PRN

## 2014-12-18 MED ORDER — DIPHENHYDRAMINE HCL 25 MG PO CAPS
25.0000 mg | ORAL_CAPSULE | ORAL | Status: AC
  Administered 2014-12-18: 25 mg via ORAL
  Filled 2014-12-18: qty 1

## 2014-12-18 MED ORDER — SODIUM CHLORIDE 0.9 % IJ SOLN
3.0000 mL | INTRAMUSCULAR | Status: DC | PRN
Start: 2014-12-18 — End: 2014-12-18

## 2014-12-18 MED ORDER — DEXTROSE-NACL 5-0.45 % IV SOLN
INTRAVENOUS | Status: DC
  Administered 2014-12-18: 1000 mL via INTRAVENOUS

## 2014-12-18 NOTE — Progress Notes (Signed)
[place] [facility]. Lithotripsy is to be rescheduled for 1430 due to the fact that patient states he" took a sip of milk at 0830" and Pt needs to be NPO for 6 hours.  Pt had informed me that he took the sip of milk at 0730 but stated it was 0830 instead of 0730.

## 2014-12-18 NOTE — Discharge Instructions (Signed)
Lithotripsy for Kidney Stones °Lithotripsy is a treatment that can sometimes help eliminate kidney stones and pain that they cause. A form of lithotripsy, also known as extracorporeal shock wave lithotripsy, is a nonsurgical procedure that helps your body rid itself of the kidney stone when it is too big to pass on its own. Extracorporeal shock wave lithotripsy is a method of crushing a kidney stone with shock waves. These shock waves pass through your body and are focused on your stone. They cause the kidney stones to crumble while still in the urinary tract. It is then easier for the smaller pieces of stone to pass in the urine. °Lithotripsy usually takes about an hour. It is done in a hospital, a lithotripsy center, or a mobile unit. It usually does not require an overnight stay. Your health care provider will instruct you on preparation for the procedure. Your health care provider will tell you what to expect afterward. °LET YOUR HEALTH CARE PROVIDER KNOW ABOUT: °· Any allergies you have. °· All medicines you are taking, including vitamins, herbs, eye drops, creams, and over-the-counter medicines. °· Previous problems you or members of your family have had with the use of anesthetics. °· Any blood disorders you have. °· Previous surgeries you have had. °· Medical conditions you have. °RISKS AND COMPLICATIONS °Generally, lithotripsy for kidney stones is a safe procedure. However, as with any procedure, complications can occur. Possible complications include: °· Infection. °· Bleeding of the kidney. °· Bruising of the kidney or skin. °· Obstruction of the ureter. °· Failure of the stone to fragment. °BEFORE THE PROCEDURE °· Do not eat or drink for 6-8 hours prior to the procedure. You may, however, take the medications with a sip of water that your physician instructs you to take °· Do not take aspirin or aspirin-containing products for 7 days prior to your procedure °· Do not take nonsteroidal anti-inflammatory  products for 7 days prior to your procedure °PROCEDURE °A stent (flexible tube with holes) may be placed in your ureter. The ureter is the tube that transports the urine from the kidneys to the bladder. Your health care provider may place a stent before the procedure. This will help keep urine flowing from the kidney if the fragments of the stone block the ureter. You may have an IV tube placed in one of your veins to give you fluids and medicines. These medicines may help you relax or make you sleep. During the procedure, you will lie comfortably on a fluid-filled cushion or in a warm-water bath. After an X-ray or ultrasound exam to locate your stone, shock waves are aimed at the stone. If you are awake, you may feel a tapping sensation as the shock waves pass through your body. If large stone particles remain after treatment, a second procedure may be necessary at a later date. °For comfort during the test: °· Relax as much as possible. °· Try to remain still as much as possible. °· Try to follow instructions to speed up the test. °· Let your health care provider know if you are uncomfortable, anxious, or in pain. °AFTER THE PROCEDURE  °After surgery, you will be taken to the recovery area. A nurse will watch and check your progress. Once you're awake, stable, and taking fluids well, you will be allowed to go home as long as there are no problems. You will also be allowed to pass your urine before discharge. You may be given antibiotics to help prevent infection. You may also be prescribed   pain medicine if needed. In a week or two, your health care provider may remove your stent, if you have one. You may first have an X-ray exam to check on how successful the fragmentation of your stone has been and how much of the stone has passed. Your health care provider will check to see whether or not stone particles remain. °SEEK IMMEDIATE MEDICAL CARE IF: °· You develop a fever or shaking chills. °· Your pain is not  relieved by medicine. °· You feel sick to your stomach (nauseated) and you vomit. °· You develop heavy bleeding. °· You have difficulty urinating. °· You start to pass your stent from your penis. °Document Released: 12/09/2000 Document Revised: 10/02/2013 Document Reviewed: 06/27/2013 °ExitCare® Patient Information ©2015 [employer], LLC. This information is not intended to replace advice given to you by your health care provider. Make sure you discuss any questions you have with your health care provider. ° °

## 2014-12-18 NOTE — Interval H&P Note (Signed)
History and Physical Interval Note:   He was found to have a fever when he was taken to the litho truck on Monday and I ended up placing a left stent instead.  He is on cipro now.   12/18/2014 1:35 PM  Douglas Bailey  has presented today for surgery, with the diagnosis of LEFT UPJ STONE   The various methods of treatment have been discussed with the patient and family. After consideration of risks, benefits and other options for treatment, the patient has consented to  Procedure(s): LEFT EXTRACORPOREAL SHOCK WAVE LITHOTRIPSY (ESWL) (Left) as a surgical intervention .  The patient's history has been reviewed, patient examined, no change in status, stable for surgery.  I have reviewed the patient's chart and labs.  Questions were answered to the patient's satisfaction.     [name] J

## 2014-12-18 NOTE — H&P (View-Only) (Signed)
Active Problems Problems  1. Renal calculus, left (N20.0)  History of Present Illness Douglas Bailey is a 23 yo WM who is sent with a 9mm left renal stone.  [facility] for left flank pain. The CT showed a 9mm renal pelvic stone without obstruction. The pain abated with medication but he is hurting again. He has had no prior stones. He has had no GU surgery or UTIs.  He has had gross hematuria and some straining to void. The pain is severe.  His stone was 700+ HU on CT but he has uric acid crystals in the urine today.   Past Medical History Problems  1. History of Anxiety (F41.9) 2. History of Bipolar disorder (F31.9) 3. History of depression (Z86.59)  Surgical History Problems  1. History of No Surgical Problems  Current Meds 1. BusPIRone HCl - 15 MG Oral Tablet;  Therapy: (Recorded:21Dec2015) to Recorded 2. Citalopram Hydrobromide 20 MG Oral Tablet;  Therapy: (Recorded:21Dec2015) to Recorded 3. LaMICtal 100 MG Oral Tablet;  Therapy: (Recorded:21Dec2015) to Recorded 4. Ondansetron 4 MG Oral Tablet Dispersible;  Therapy: (Recorded:21Dec2015) to Recorded 5. Oxycodone-Acetaminophen 5-325 MG Oral Tablet;  Therapy: (Recorded:21Dec2015) to Recorded  Allergies Medication  1. No Known Drug Allergies  Family History Problems  1. Family history of kidney stones (Z84.1) : Mother, Father 2. Family history of urinary tract infection (Z84.2) : Mother  Social History Problems    Denied: History of Alcohol use   Never a smoker   Occupation   not employed   Single  Review of Systems Genitourinary, constitutional, skin, eye, otolaryngeal, hematologic/lymphatic, cardiovascular, pulmonary, endocrine, musculoskeletal, gastrointestinal, neurological and psychiatric system(s) were reviewed and pertinent findings if present are noted and are otherwise negative.  Genitourinary: urinary frequency, dysuria, hematuria and initiating urination requires straining.   Gastrointestinal: nausea and flank pain.  Constitutional: feeling tired (fatigue).  Psychiatric: anxiety and depression.    Vitals Vital Signs [Data Includes: Last 1 Day]  Recorded: 21Dec2015 12:25PM  Height: 5 ft 8 in Weight: 110 lb  BMI Calculated: 16.73 BSA Calculated: 1.59 Blood Pressure: 107 / 72 Temperature: 97.8 F Heart Rate: 75  Physical Exam Constitutional: Well nourished and well developed . In acute distress. With pain.  ENT:. The ears and nose are normal in appearance. 2 lip rings.  Neck: The appearance of the neck is normal and no neck mass is present.  Pulmonary: No respiratory distress and normal respiratory rhythm and effort.  Cardiovascular: Heart rate and rhythm are normal . No peripheral edema.  Abdomen: No masses are palpated. Severe tenderness in the LLQ is present. severe left CVA tenderness. No hernias are palpable. No hepatosplenomegaly noted.  Lymphatics: The posterior cervical and supraclavicular nodes are not enlarged or tender.  Skin: Normal skin turgor, no visible rash and no visible skin lesions.  Neuro/Psych:. Mood and affect are appropriate.    Results/Data Urine [Data Includes: Last 1 Day]   21Dec2015  COLOR AMBER   APPEARANCE CLOUDY   SPECIFIC GRAVITY 1.020   pH 6.0   GLUCOSE NEG mg/dL  BILIRUBIN NEG   KETONE NEG mg/dL  BLOOD LARGE   PROTEIN 30 mg/dL  UROBILINOGEN 0.2 mg/dL  NITRITE NEG   LEUKOCYTE ESTERASE NEG   SQUAMOUS EPITHELIAL/HPF NONE SEEN   WBC NONE SEEN WBC/hpf  RBC TNTC RBC/hpf  BACTERIA NONE SEEN   CRYSTALS Uric Acid crystals noted   CASTS NONE SEEN    Old records or history reviewed: I have reviewed the ER  records.  The following images/tracing/specimen were independentlyVa Medical Center - Batavi65[facility] GrapeviOZH:YLucianaKent[place] KoreSaAurea [id number] - Harlingen9Sterlington Rehabilitation HospitalHarlLadon30iaLester C639Id[facility] P23Naples Eye Surgery CentOZH:YLucianaKentucky KoreSaAurea [id number]9Proliance Highlands Surgery CenterHarlAnders49onLester CChristus St. Frances Cabrini Hospita25Grande Ronde HospitOZH:YLucianaKentucky KoreSaAurea [id number] [facility]. [facility]9Wiregrass Medical CenterHarlVandeme18reLester C339Obie DreCedar Park Surgery CeFort Myers Surgery Cente20Advanced Eye Surgery Center OZH:YLucianaKentucky KoreSaAurea [id number] [place]9Dearborn Surgery Center LLC Dba Dearborn Surgery CenterHarlSigourn6eyLester CCarolina Mountain Gastroenterology Endoscopy Center LL43Ridge Lake Asc LOZH:YLucianaKentucky KoreSaAurea [id number] Medical Center At Caverna9Blueridge Vista Health And WellnessHarlUpper Bear Cre64ekLesAdventist Health Simi Valle71St Joseph Health CentOZH:YLucianaKentucky KoreSaAurea Gr284282Chi Health St. Elizabeth9Thomas HospitalHarlVero Beach Sou38thEye Surgicenter Of New Jerse77Landmark Hospital Of Southwest FloriOZH:YLucianaKentucky KoreSaAurea Gr2843282Northern Michigan Surgical Suites9Watsonville Community HospitalHarlJunction COroville Hospita71Royal Oaks HospitOZH:YLucianaKentucky KoreSaAurea [id number]9Laser Surgery CtrHarlGran20byLester C853Obie DChristus Dubuis Hospital Of Beaumon72Great River Medical CentOZH:YLucianaKentucky KoreSaAurea Gr284382Select [facility] [place]9Select Specialty Hospital - [facility] BoardmanHarlLake Providen77ceLester C66Northwest Spine And Laser Surgery Center LL49Wellspan Ephrata Community HospitOZH:YLucianaKent[place] KoreSaAurea Gr284282Centra Lynch[facility]9Pioneer Memorial HospitalHarlNew Lla47noLKindred Hospital Bay Are73Lawnwood Pavilion - Psychiatric HospitOZH:YLucianaKentucky KoreSaAurea [id number]9South Austin Surgicenter LLCHarlAllenda76leLester Northern Light A R Gould Hospita64Wayne Memorial HospitOZH:YLucianaKent[place] KoreSaAurea [id number] Lake Medical Center-Ingleside Campus9Providence Surgery CenterHarlLake Ci3tyLester C56Department Of Veterans Affairs Medical Cente26Hosp San Carlos BorromOZH:YLucianaKent[place] KoreSaAurea Gr2846582[facility]9Surgery Center Of Chesapeake LLCHarlParadi25seLester C33Obie DreRiverside Behavioral Health KentuckyeCoConnecticut Childbirth & Women'S Cente66Surgery Center At St Vincent LLC Dba East Pavilion Surgery CentOZH:YLucianaKent[place] KoreSaAurea Gr2843582Nyu Hospitals Center9Kindred Hospital Sugar LandHarlHolden Bea11chLester C395Obie Mcleod Medical Center-Darlingto70Kindred Hospital Baldwin PaOZH:YLucianaKent[place] KoreSaAurea Gr2842482Kansas Spine Hospital LLC9Encompass Health Rehabilitation Hospital Of AbileneHarlLittle Eag74leRoseland Community Hospita65Orlando Outpatient Surgery CentOZH:YLucianaKent[place] KoreSaAurea Gr2845882Divine Savior Hlthcare9Missouri Baptist Hospital Of SullivanHarlSal8ixLester C457ObGlobal Microsurgical Center LL51United Hospital CentOZH:YLucianaKentucky KoreSaAurea Gr284182Uchealth Grandview Hospital9Millinocket Regional HospitalHarlCavali66erLester C6Washington Gastroenterolog2Wamego Health CentOZH:YLucianaKentucky KoreSaAurea [id number] [facility] - [place]9Hillside Endoscopy Center LLCHarlPowers La38keLester CQuad City Ambulatory Surgery Center LL20Nhpe LLC Dba [place] Park EndoscoOZH:YLucianaK[place] KoreSaAurea [id number] Surgical Center LLP9Shannon Medical Center St Johns CampusHarlTenna10ntLesteChesterton Surgery Center LL46Endoscopy Center Of Pennsylania HospitOZH:YLucianaK[place] KoreSaAurea Gr2846882St. Mary'S Medical Center9Adventhealth HendersonvilleHarlBelvide11reLesterCheyenne Regional Medical Cente35Kindred Hospital El PaOZH:YLucianaK[place] KoreSaAurea Gr2847082Dupage Eye Surgery Center LLC9Wasatch Front Surgery Center LLCHarlParco21alLester C3695OBaylor Scott White Surgicare Plan5Oaklawn Psychiatric Center IOZH:YLucianaK[place] KoreSaAurea Gr2848482Bayfront Health Port Charlotte9Indiana Regional Medical CenterHarlNels73onLester C3214Obie DreColumbia GastrointestiNorth Valley Hospita31Avoyelles HospitOZH:YLucianaK[place] KoreSaAurea Gr2844382Riverlakes Surgery Center LLC9Lovelace Westside HospitalHarlOnyc62haLester C4769Obie DreSurprise Valley Community HoKentuckyspMontgoEmory Healthcar69Southern Maryland Endoscopy Center LOZH:YLucianaKent[place] KoreSaAurea Gr2843382Methodist [facility] - F[facility]9Chi Health LakesideHarlBrush Prair55ieLester C43Obie DreRestpadd PsychiKaiser Fnd Hosp - Santa Ros73Community Health Network Rehabilitation HospitOZH:YLucianaKentucky KoreSaAurea Gr2843282Skyline Surgery Center9Pembina County Memorial HospitalHarlDowni64ngLeKansas Heart Hospita74Candler County HospitOZH:YLucianaKent[place] KoreSaAurea Gr2842882St. Vincent'S Blount9St Joseph Medical Center-MainHarlMokelumne Hi50llLester C6648Obie DreWindsor Laurelwood Center For Behavorial MeKentuckydiMaine Eye Care AsMistOZH28482Cornerstone Hospital Of BossierLutheran Campus As14Resurgens East Surgery Center LOZH:YLucianaKent[place] KoreSaAurea Gr2843682Unity Medical Center9Piedmont Geriatric HospitalHarlEd55enLester C1285OAdvanced Urology Surgery Cente85West Bloomfield Surgery Center LLC Dba Lakes Surgery CentOZH:YLucianaK[place] KoreSaAurea Gr2843282Medstar Franklin Square Medical Center9Pershing General HospitalHarlNorth Lindenhur71stLester C455Obie DreEndoscopic Procedure Center LL64West Creek Surgery CentOZH:YLucianaKent[place] KoreSaAurea Gr2845382Com[facility]9San Antonio Gastroenterology Endoscopy Center Med CenterHarlSahuari52taLester C1571Obie DreSsm HeaWny Medical Management LL23East Memphis Urology Center Dba UrocentOZH:YLucianaKent[place] KoreSaAurea Gr2844382Suburban Hospital9Seabrook HouseHarlCanyon Cre78ekLester C9100Obie DreMKindred Hospital - Tarrant Count80The Reading Hospital Surgicenter At Spring Ridge LOZH:YLucianaKent[place] KoreSaAurea Gr2846782Haxtun Hospital District9Hosp San Carlos BorromeoHarlFriendsvil38leLester C3856Obie Buford Eye Surgery Cente85East Georgia Regional Medical CentOZH:YLucianaKent[place] KoreSaAurea Gr2845582Henry Ford Allegiance Specialty Hospital9Maricopa Medical CenterHarlSanta Cla77raLester C72Obie DreDignity Health St. Rose DominBanner Del E. Webb Medical Cente39Mark Fromer LLC Dba Eye Surgery Centers Of New YoOZH:YLucianaKent[place] KoreSaAurea Gr2848782Summitridge Center- Psychiatry & Addictive Med9Barnes-Jewish Hospital - NorthHarlTalihi42naLester C3711Obie DreSt CatherDelta Regional Medical Center - West CampuAdventhealth Rollins Brook Community HospitOZH:YLucianaKentucky KoreSaAurea Gr2848282Stephens Memorial [facility]9Endoscopy Center Of South Jersey P CHarlKoyuk37ukLester C5481Obie DreEliTaylor Regional Hospita42Advent Health Dade CiOZH:YLucianaKentucky KoreSaAurea Gr284482Cura[facility]9Byrd Regional HospitalHarlSolva80ngLester C450Obie DreVanderbilt StallwUtah Surgery Center L75Olean General HospitOZH:YLucianaKentucky KoreSaAurea Gr2842282Carolinas Endoscopy Center University9Weslaco Rehabilitation HospitalHarlGalevil52leLester C1584Obie DreMetrFaulkton Area Medical Cente5Complex Care Hospital At RidgelaOZH:YLucianaKentucky KoreSaAurea Gr284582Keokuk Area Hospital9Prague Community HospitalHarlConcreBurke Medical Cente51Sierra Endoscopy CentOZH:YLucianaKentucky KoreSaAurea Gr2842482Community Hospital Of San Bernardino9Northern Hospital Of Surry CountyHarlOpa-loc100kaLester C8355ObiVantage Surgery Center L6Columbus Community HospitOZH:YLucianaKent[place] KoreSaAurea Gr2845182University Endoscopy Center9Huntingdon Valley Surgery CenterHarlSide64llLester C6693Surgical Center For Urology LL66Ssm Health St Marys Janesville HospitOZH:YLucianaKentucky KoreSaAurea Gr28482Banner Phoenix Surgery Center LLC9Cartersville Medical CenterHarlRuff35inLester C4436Obie DreIndiana UniverWest Feliciana Parish Hospita18Mission Oaks HospitOZH:YLucianaKentucky KoreSaAurea Gr2844782Community Memorial Hospital-San Buenaventura9Essentia Health SandstoneHarlWhite Clo83udLester South Central Regional Medical Cente19Harper Hospital District NoOZH:YLucianaKentucky KoreSaAurea Gr2846582Zion Eye Institute Inc9Novant Health Rehabilitation HospitalHarlFlorida Rid48geLester C496Obie DrNorthern Virginia Eye Surgery Center LL5Yale-New Haven HospitOZH:YLucianaKentucky KoreSaAurea Gr28482Spine Sports Surgery Center LLC9The Ent Center Of Rhode Island LLCHarlShorewo87odLester C848OThe Rehabilitation Institute Of St. Loui58Liberty Ambulatory Surgery Center LOZH:YLucianaKentucky KoreSaAurea Gr2846882Fredericksburg Ambulatory Surgery Center LLC9Oregon Surgical InstituteHarlThree For55ksLester Nocona General Hospita65Northern Westchester HospitOZH:YLucianaKentucky KoreSaAurea Gr2844382Pacific Surgery Center9River Crest HospitalHarlValer48iaLesteHill Hospital Of Sumter Count64The Center For Digestive And Liver Health And The Endoscopy CentOZH:YLucianaKentucky KoreSaAurea Gr2846282St Vincent Dunn [facility] Inc9Four Corners Ambulatory Surgery Center LLCHarlHarr29isLester C362Obie DreThe Rehabilitation InstituRiverview Regional Medical Cente27Newport Beach Orange Coast EndoscoOZH:YLucianaKentucky KoreSaAurea Gr2847782Premier Health Associates LLC9Eye Care Surgery Center SouthavenHarlWoodland Hil77lsLester C433ObOregon Surgicenter LL84Guam Regional Medical CiOZH:YLucianaKentucky KoreSaAurea Gr2847582Tri City Orthopaedic Clinic Psc9Naval Branch Health Clinic BangorHarlWiggi11nsLester C419Obie Cataract Ctr Of East T61Chambersburg HospitOZH:YLucianaKentucky KoreSaAurea Gr2842282Penn Presbyterian [facility] Center9Christus Spohn Hospital BeevilleHarlSanta Nel91laLester C6189Obie DreGLakeland Surgical And Diagnostic Center LLP Griffin Campu33Creekwood Surgery Center OZH:YLucianaKentucky KoreSaAurea Gr2847282An[facility]9Silver Hill Hospital, Inc.HarlMari64onLesteHosp General Menonita - Aibonit55Surgery Center Of LawrencevilOZH:YLucianaKentucky KoreSaAurea Gr2845082[facility] J[facility]lin9Northeast Rehab HospitalHarlKlama35thLester C694Obie DreBaylor Scott And White SpoPalmetto Surgery Center LL22Mahnomen Health CentOZH:YLucianaKentucky KoreSaAurea Gr2841182Navicent Health Baldwin9Shrewsbury Surgery CenterHarlOneka27maLester C74Obie DreMontgomery Roseburg Va Medical Cente24Plastic Surgical Center Of MississipOZH:YLucianaKentucky KoreSaAurea Gr2842282Community Westview Hospital9Providence St Vincent Medical CenterHarlEl La78goLestePresbyterian Hospital As34Cumberland River HospitOZH:YLucianaKentucky KoreSaAurea Gr2843882Kunesh Eye Surgery Center9Zion Eye Institute IncHarlDripping Sprin17gsMonroeville Ambulatory Surgery Center LL95Cumberland Hospital For Children And AdolescenOZH:YLucianaKentucky KoreSaAurea Gr2844182Eastern La Mental Health System9Lasalle General HospitalHarlNeo88laLester C330Obie DIowa Endoscopy Cente47Hurley Medical CentOZH:YLucianaKentucky KoreSaAurea Gr2843782Spotsylvania Regional [facility] Center9Premier Asc LLCHarlBlountsvil109leLester C806ObieRush Copley Surgicenter LL65Harper County Community HospitOZH:YLucianaKentucky KoreSaAurea Gr2842682Kansas Medical Center LLC9Va Medical Center - DallasHarlWest College Corn26erLester C537Obi62eDoLuciana KoreaAAurea GrafHarlCampoLester C589Obie DredgDominican Hospital-Santa CruMisty [phone] KentuckyeH Lee Moffitt Cancer Ctr & ReseaMisty Stan17le2Gildardo nical lab reports were reviewed:  UA reviewed.     Assessment Assessed  1. Renal calculus, left (N20.0)  He has a left UPJ stone with severe pain that was relieved with morphine but the stone will not pass.   Plan Health Maintenance  1. UA With REFLEX; [Do Not Release]; Status:Resulted - Requires Verification;   Done:  21Dec2015 11:57AM Renal calculus, left  2. Administered: Morphine Sulfate 15 MG/ML Injection Solution 3. Administered: Promethazine HCl 25 MG/ML Injection Solution 4. Follow-up Schedule Surgery Office  Follow-up  Status: Hold For - Appointment   Requested for: 21Dec2015 5. KUB; Status:Resulted - Requires Verification;   Done: 21Dec2015 12:00AM  I discussed ESWL and ureteroscopy and will try to get him added on for an ESWL today. I reviewed the risks of bleeding, infection, need for secondary procedures, injury to the kidney or adjacent structures, skin changes, thrombotic events and sedation complications.   Discussion/Summary CC: [facility] in [place].

## 2014-12-20 ENCOUNTER — Emergency Department (HOSPITAL_COMMUNITY)
Admission: EM | Admit: 2014-12-20 | Discharge: 2014-12-20 | Disposition: A | Discharge status: Home / Self Care | Payer: Medicaid Other | Attending: Emergency Medicine | Admitting: Emergency Medicine

## 2014-12-20 ENCOUNTER — Encounter (HOSPITAL_COMMUNITY): Payer: Self-pay | Admitting: Emergency Medicine

## 2014-12-20 ENCOUNTER — Emergency Department (HOSPITAL_COMMUNITY): Payer: Medicaid Other

## 2014-12-20 DIAGNOSIS — F419 Anxiety disorder, unspecified: Secondary | ICD-10-CM | POA: Diagnosis not present

## 2014-12-20 DIAGNOSIS — R8271 Bacteriuria: Secondary | ICD-10-CM

## 2014-12-20 DIAGNOSIS — Z792 Long term (current) use of antibiotics: Secondary | ICD-10-CM | POA: Insufficient documentation

## 2014-12-20 DIAGNOSIS — Y832 Surgical operation with anastomosis, bypass or graft as the cause of abnormal reaction of the patient, or of later complication, without mention of misadventure at the time of the procedure: Secondary | ICD-10-CM | POA: Insufficient documentation

## 2014-12-20 DIAGNOSIS — R109 Unspecified abdominal pain: Secondary | ICD-10-CM

## 2014-12-20 DIAGNOSIS — Z87442 Personal history of urinary calculi: Secondary | ICD-10-CM | POA: Diagnosis not present

## 2014-12-20 DIAGNOSIS — F319 Bipolar disorder, unspecified: Secondary | ICD-10-CM | POA: Diagnosis not present

## 2014-12-20 DIAGNOSIS — T8384XA Pain from genitourinary prosthetic devices, implants and grafts, initial encounter: Secondary | ICD-10-CM

## 2014-12-20 DIAGNOSIS — Z79899 Other long term (current) drug therapy: Secondary | ICD-10-CM | POA: Insufficient documentation

## 2014-12-20 DIAGNOSIS — N390 Urinary tract infection, site not specified: Secondary | ICD-10-CM | POA: Diagnosis not present

## 2014-12-20 DIAGNOSIS — T83192A Other mechanical complication of urinary stent, initial encounter: Secondary | ICD-10-CM | POA: Insufficient documentation

## 2014-12-20 LAB — BASIC METABOLIC PANEL
ANION GAP: 7 (ref 5–15)
BUN: 15 mg/dL (ref 6–23)
CHLORIDE: 100 meq/L (ref 96–112)
CO2: 27 mmol/L (ref 19–32)
Calcium: 9.5 mg/dL (ref 8.4–10.5)
Creatinine, Ser: 0.75 mg/dL (ref 0.50–1.35)
GFR calc Af Amer: >90 mL/min (ref >90)
GFR calc non Af Amer: >90 mL/min (ref >90)
Glucose, Bld: 116 mg/dL — ABNORMAL HIGH (ref 70–99)
Potassium: 3.9 mmol/L (ref 3.5–5.1)
Sodium: 134 mmol/L — ABNORMAL LOW (ref 135–145)

## 2014-12-20 LAB — CBC
HEMATOCRIT: 39.9 % (ref 39.0–52.0)
HEMOGLOBIN: 13.7 g/dL (ref 13.0–17.0)
MCH: 30.2 pg (ref 26.0–34.0)
MCHC: 34.3 g/dL (ref 30.0–36.0)
MCV: 88.1 fL (ref 78.0–100.0)
Platelets: 283 10*3/uL (ref 150–400)
RBC: 4.53 MIL/uL (ref 4.22–5.81)
RDW: 11.8 % (ref 11.5–15.5)
WBC: 8.6 10*3/uL (ref 4.0–10.5)

## 2014-12-20 LAB — URINALYSIS, ROUTINE W REFLEX MICROSCOPIC
GLUCOSE, UA: NEGATIVE mg/dL
Ketones, ur: 15 mg/dL — AB
Nitrite: POSITIVE — AB
PH: 5.5 (ref 5.0–8.0)
Protein, ur: >300 mg/dL — AB
Specific Gravity, Urine: 1.025 (ref 1.005–1.030)
Urobilinogen, UA: 1 mg/dL (ref 0.0–1.0)

## 2014-12-20 LAB — URINE MICROSCOPIC-ADD ON

## 2014-12-20 MED ORDER — DEXTROSE 5 % IV SOLN
1.0000 g | Freq: Once | INTRAVENOUS | Status: AC
  Administered 2014-12-20: 1 g via INTRAVENOUS
  Filled 2014-12-20: qty 10

## 2014-12-20 MED ORDER — ONDANSETRON HCL 4 MG/2ML IJ SOLN
4.0000 mg | Freq: Once | INTRAMUSCULAR | Status: AC
  Administered 2014-12-20: 4 mg via INTRAVENOUS
  Filled 2014-12-20: qty 2

## 2014-12-20 MED ORDER — HYDROMORPHONE HCL 1 MG/ML IJ SOLN: 0.5000 mg | INTRAMUSCULAR | Status: DC | PRN

## 2014-12-20 MED ORDER — KETOROLAC TROMETHAMINE 30 MG/ML IJ SOLN
30.0000 mg | Freq: Once | INTRAMUSCULAR | Status: AC
Start: 2014-12-20 — End: 2014-12-20
  Administered 2014-12-20: 30 mg via INTRAVENOUS
  Filled 2014-12-20: qty 1

## 2014-12-20 NOTE — ED Provider Notes (Signed)
CSN: [id number]     Arrival date & time 12/20/14  [id number] History   First MD Initiated Contact with Patient 12/20/14 0801     Chief Complaint  Patient presents with  . Flank Pain     HPI Patient presents to the emergency department complaining of left flank pain.  Patient underwent left ureteral stent for proximal left ureteral stone approximate 5 days ago.  He underwent lithotripsy 48 hours ago.  Initially he was placed on ciprofloxacin 5 days ago when he developed a fever.  No urine culture results are available.  Denies fever.  Reports nausea without vomiting.  States his left flank pain is intermittent.  Pain is mild to moderate in severity at this time.   Past Medical History  Diagnosis Date  . Depression   . Bipolar disorder   . Anxiety   . History of kidney stones 2015  . Nausea   . Retained ureteral stent 12/21  . Dysuria   . Hematuria    Past Surgical History  Procedure Laterality Date  . Mouth surgery Bilateral 2013  . Cystoscopy w/ ureteral stent placement Left 12/15/14  . Cystoscopy with retrograde pyelogram, ureteroscopy and stent placement Left 12/15/2014    Procedure: CYSTOSCOPY STENT PLACEMENT left ureter;  Surgeon: Anner CreteJohn J Wrenn, MD;  Location: [facility];  Service: Urology;  Laterality: Left;   No family history on file. History  Substance Use Topics  . Smoking status: Never Smoker   . Smokeless tobacco: Not on file  . Alcohol Use: No    Review of Systems  All other systems reviewed and are negative.     Allergies  Review of patient's allergies indicates no known allergies.  Home Medications   Prior to Admission medications   Medication Sig Start Date End Date Taking? Authorizing Provider  busPIRone (BUSPAR) 15 MG tablet Take 15 mg by mouth 2 (two) times daily. 11/22/14  Yes Historical Provider, MD  ciprofloxacin (CIPRO) 500 MG tablet Take 1 tablet (500 mg total) by mouth 2 (two) times daily. 12/15/14  Yes Anner CreteJohn J Wrenn, MD  citalopram (CELEXA) 20 MG  tablet Take 20 mg by mouth every morning.  11/27/14  Yes Historical Provider, MD  FOCALIN XR 5 MG 24 hr capsule Take 5 mg by mouth daily. 10/25/14  Yes Historical Provider, MD  lamoTRIgine (LAMICTAL) 100 MG tablet Take 100 mg by mouth at bedtime. 09/07/14  Yes Historical Provider, MD  ondansetron (ZOFRAN ODT) 4 MG disintegrating tablet Take 1 tablet (4 mg total) by mouth every 8 (eight) hours as needed for nausea or vomiting. 12/14/14  Yes Elson AreasLeslie K Sofia, PA-C  oxyCODONE-acetaminophen (PERCOCET/ROXICET) 5-325 MG per tablet Take 1 tablet by mouth every 4 (four) hours as needed for severe pain. 12/15/14  Yes Anner CreteJohn J Wrenn, MD  phenazopyridine (PYRIDIUM) 200 MG tablet Take 1 tablet (200 mg total) by mouth 3 (three) times daily as needed for pain. 12/15/14  Yes Anner CreteJohn J Wrenn, MD   BP 106/79 mmHg  Pulse 108  Temp(Src) 98.8 F (37.1 C) (Oral)  Resp 18  Ht 5\' 8"  (1.727 m)  Wt 100 lb (45.36 kg)  BMI 15.21 kg/m2  SpO2 100% Physical Exam  Constitutional: He is oriented to person, place, and time. He appears well-developed and well-nourished.  HENT:  Head: Normocephalic and atraumatic.  Eyes: EOM are normal.  Neck: Normal range of motion.  Cardiovascular: Normal rate, regular rhythm, normal heart sounds and intact distal pulses.   Pulmonary/Chest: Effort normal and breath sounds  normal. No respiratory distress.  Abdominal: Soft. He exhibits no distension. There is no tenderness.  Musculoskeletal: Normal range of motion.  Neurological: He is alert and oriented to person, place, and time.  Skin: Skin is warm and dry.  Psychiatric: He has a normal mood and affect. Judgment normal.  Nursing note and vitals reviewed.   ED Course  Procedures (including critical care time) Labs Review Labs Reviewed  CBC  BASIC METABOLIC PANEL  URINALYSIS, ROUTINE W REFLEX MICROSCOPIC    Imaging Review Dg Abd 1 View  12/20/2014   CLINICAL DATA:  Left-sided abdominal pain for 24 hr with nausea and constipation.  Diagnosis with kidney stone 1 week ago with placement of ureteral stent.  EXAM: ABDOMEN - 1 VIEW  COMPARISON:  12/18/2014  FINDINGS: Double-J left-sided internal ureteral stent is unchanged and in adequate position. There is an approximate 4 mm calcific density adjacent the stent in the region of the proximal to mid ureter as this apparent stone is slightly more distal compared to the prior exam. Bowel gas pattern is nonobstructive with mild fecal retention throughout the colon. Remainder of the exam is unchanged.  IMPRESSION: Nonobstructive bowel gas pattern with mild fecal retention throughout the colon.  Left-sided double-J internal ureteral stent in adequate position. 4 mm calcific density adjacent the stent over the proximal to mid ureter with only slight distal movement compared to the previous exam.   Electronically Signed   By: Elberta Fortisaniel  Boyle M.D.   On: 12/20/2014 08:45   [name]  12/20/2014   CLINICAL DATA:  Left flank pain  EXAM: RENAL/URINARY TRACT ULTRASOUND COMPLETE  COMPARISON:  12/11/2014  FINDINGS: Right Kidney:  Length: 9.7 cm in length. No hydronephrosis. 2.5 x 2.3 x 2.2 cm central soft tissue mass versus column of Bertin.  Left Kidney:  Length: 10.9 cm in length. No mass. Normal echogenicity. Mild hydronephrosis.  Bladder:  2.4 cm mass at the base of the bladder with some internal vascularity.  IMPRESSION: 2.5 cm possible mass in the right kidney. It may simply represent a column of Bertin, a normal variant. MR with contrast is recommended.  2.4 cm mass in the bladder. Transitional cell carcinoma is not excluded. Correlate with cystoscopy.  Left hydronephrosis.   Electronically Signed   By: Maryclare BeanArt  Hoss M.D.   On: 12/20/2014 10:58  I personally reviewed the imaging tests through PACS system I reviewed available ER/hospitalization records through the EMR    EKG Interpretation None      MDM   Final diagnoses:  Pain due to ureteral stent, initial encounter  Left flank pain     Patient seems to be much more comfortable this time.  I suspect this is ureteral colic that he is feeling.  Stent appears in good place.  Ultrasound demonstrate only mild hydronephrosis.  I spoke with Dr. Isabel CapriceGrapey regarding his urinalysis as he does have nitrite positive urine and now has bacteriuria as well.  He is currently on ciprofloxacin.  There are no urine culture results available in Epic and no urine culture was obtained in the urology office either.  Recommendation this time is IV dose of Rocephin and to continue the patient on the ciprofloxacin.  He already has scheduled follow-up on Monday, December 28 with the urologist.  He understands to return to the ER for any new or worsening symptoms.    Lyanne CoKevin M Keron Koffman, MD 12/20/14 1226

## 2014-12-20 NOTE — ED Notes (Signed)
Pt returned from XRAY 

## 2014-12-20 NOTE — ED Notes (Signed)
US completed

## 2014-12-20 NOTE — ED Notes (Addendum)
US was called to check on status unable to reach answer. Will check back.

## 2014-12-20 NOTE — ED Notes (Signed)
Pt from home c/o left flank pain and left lower abdominal pain. Pt recently diagnosed with a kidney stone  And had lithotripsy on 12/24. He is scheduled to have a cystoscopy with left uretal stent removal on Monday. Rates pain 5/10. He took Roxicodone at 0500 with some relief.

## 2014-12-20 NOTE — ED Notes (Signed)
Pt aware of the need for a urine sample. Urinal provided to the patient.

## 2014-12-20 NOTE — ED Notes (Signed)
MD Campos at bedside.  

## 2014-12-21 LAB — URINE CULTURE
CULTURE: NO GROWTH
Colony Count: NO GROWTH

## 2014-12-22 ENCOUNTER — Ambulatory Visit (HOSPITAL_COMMUNITY): Payer: Medicaid Other

## 2014-12-22 ENCOUNTER — Ambulatory Visit (HOSPITAL_BASED_OUTPATIENT_CLINIC_OR_DEPARTMENT_OTHER): Payer: Medicaid Other | Admitting: Anesthesiology

## 2014-12-22 ENCOUNTER — Encounter (HOSPITAL_BASED_OUTPATIENT_CLINIC_OR_DEPARTMENT_OTHER): Payer: Self-pay | Admitting: Anesthesiology

## 2014-12-22 ENCOUNTER — Encounter (HOSPITAL_BASED_OUTPATIENT_CLINIC_OR_DEPARTMENT_OTHER)
Admission: RE | Disposition: A | Discharge status: Home / Self Care | Payer: Medicaid Other | Source: Ambulatory Visit | Attending: Urology

## 2014-12-22 ENCOUNTER — Ambulatory Visit (HOSPITAL_BASED_OUTPATIENT_CLINIC_OR_DEPARTMENT_OTHER)
Admission: RE | Admit: 2014-12-22 | Discharge: 2014-12-22 | Disposition: A | Discharge status: Home / Self Care | Payer: Medicaid Other | Source: Ambulatory Visit | Attending: Urology | Admitting: Urology

## 2014-12-22 DIAGNOSIS — F419 Anxiety disorder, unspecified: Secondary | ICD-10-CM | POA: Insufficient documentation

## 2014-12-22 DIAGNOSIS — F319 Bipolar disorder, unspecified: Secondary | ICD-10-CM | POA: Diagnosis not present

## 2014-12-22 DIAGNOSIS — F329 Major depressive disorder, single episode, unspecified: Secondary | ICD-10-CM | POA: Insufficient documentation

## 2014-12-22 DIAGNOSIS — Z466 Encounter for fitting and adjustment of urinary device: Secondary | ICD-10-CM | POA: Insufficient documentation

## 2014-12-22 DIAGNOSIS — N201 Calculus of ureter: Secondary | ICD-10-CM

## 2014-12-22 HISTORY — PX: CYSTOSCOPY W/ URETERAL STENT REMOVAL: SHX1430

## 2014-12-22 LAB — POCT HEMOGLOBIN-HEMACUE: HEMOGLOBIN: 14.2 g/dL (ref 13.0–17.0)

## 2014-12-22 SURGERY — REMOVAL, STENT, URETER, CYSTOSCOPIC
Anesthesia: Monitor Anesthesia Care | Site: Ureter | Laterality: Left

## 2014-12-22 MED ORDER — PROMETHAZINE HCL 25 MG/ML IJ SOLN
6.2500 mg | INTRAMUSCULAR | Status: DC | PRN
  Filled 2014-12-22: qty 1

## 2014-12-22 MED ORDER — MIDAZOLAM HCL 5 MG/5ML IJ SOLN
INTRAMUSCULAR | Status: DC | PRN
  Administered 2014-12-22: 2 mg via INTRAVENOUS

## 2014-12-22 MED ORDER — LACTATED RINGERS IV SOLN
INTRAVENOUS | Status: DC
Start: 2014-12-22 — End: 2014-12-22
  Filled 2014-12-22: qty 1000

## 2014-12-22 MED ORDER — FENTANYL CITRATE 0.05 MG/ML IJ SOLN
25.0000 ug | INTRAMUSCULAR | Status: DC | PRN
  Filled 2014-12-22: qty 1

## 2014-12-22 MED ORDER — LACTATED RINGERS IV SOLN
INTRAVENOUS | Status: DC
  Administered 2014-12-22: 12:00:00 via INTRAVENOUS
  Filled 2014-12-22: qty 1000

## 2014-12-22 MED ORDER — FENTANYL CITRATE 0.05 MG/ML IJ SOLN
INTRAMUSCULAR | Status: DC | PRN
  Administered 2014-12-22: 50 ug via INTRAVENOUS

## 2014-12-22 MED ORDER — MEPERIDINE HCL 25 MG/ML IJ SOLN
6.2500 mg | INTRAMUSCULAR | Status: DC | PRN
  Filled 2014-12-22: qty 1

## 2014-12-22 MED ORDER — FENTANYL CITRATE 0.05 MG/ML IJ SOLN
INTRAMUSCULAR | Status: AC
  Filled 2014-12-22: qty 4

## 2014-12-22 MED ORDER — SENNOSIDES-DOCUSATE SODIUM 8.6-50 MG PO TABS: 1.0000 | ORAL_TABLET | Freq: Two times a day (BID) | ORAL | Status: DC

## 2014-12-22 MED ORDER — MIDAZOLAM HCL 2 MG/2ML IJ SOLN
INTRAMUSCULAR | Status: AC
  Filled 2014-12-22: qty 2

## 2014-12-22 MED ORDER — ONDANSETRON HCL 4 MG/2ML IJ SOLN
INTRAMUSCULAR | Status: DC | PRN
  Administered 2014-12-22: 4 mg via INTRAVENOUS

## 2014-12-22 MED ORDER — CIPROFLOXACIN IN D5W 400 MG/200ML IV SOLN
INTRAVENOUS | Status: AC
  Filled 2014-12-22: qty 200

## 2014-12-22 MED ORDER — CIPROFLOXACIN IN D5W 400 MG/200ML IV SOLN
400.0000 mg | INTRAVENOUS | Status: DC
  Filled 2014-12-22: qty 200

## 2014-12-22 MED ORDER — LIDOCAINE HCL (CARDIAC) 20 MG/ML IV SOLN
INTRAVENOUS | Status: DC | PRN
  Administered 2014-12-22: 50 mg via INTRAVENOUS

## 2014-12-22 MED ORDER — PROPOFOL 10 MG/ML IV BOLUS
INTRAVENOUS | Status: DC | PRN
  Administered 2014-12-22: 20 mg via INTRAVENOUS
  Administered 2014-12-22: 50 mg via INTRAVENOUS

## 2014-12-22 MED ORDER — OXYCODONE-ACETAMINOPHEN 5-325 MG PO TABS: 1.0000 | ORAL_TABLET | ORAL | Status: DC | PRN

## 2014-12-22 SURGICAL SUPPLY — 20 items
BAG URO CATCHER STRL LF (DRAPE) ×3 IMPLANT
BASKET LASER NITINOL 1.9FR (BASKET) ×3 IMPLANT
BASKET ZERO TIP NITINOL 2.4FR (BASKET) IMPLANT
CATH INTERMIT  6FR 70CM (CATHETERS) IMPLANT
CLOTH BEACON ORANGE TIMEOUT ST (SAFETY) ×3 IMPLANT
DRAPE CAMERA CLOSED 9X96 (DRAPES) ×3 IMPLANT
GLOVE BIO SURGEON STRL SZ7.5 (GLOVE) ×3 IMPLANT
GLOVE INDICATOR 7.5 STRL GRN (GLOVE) ×3 IMPLANT
GLOVE SURG SS PI 7.5 STRL IVOR (GLOVE) ×6 IMPLANT
GOWN PREVENTION PLUS XLARGE (GOWN DISPOSABLE) IMPLANT
GOWN STRL NON-REIN LRG LVL3 (GOWN DISPOSABLE) IMPLANT
GOWN STRL REUS W/ TWL XL LVL3 (GOWN DISPOSABLE) ×1 IMPLANT
GOWN STRL REUS W/TWL LRG LVL3 (GOWN DISPOSABLE) ×3 IMPLANT
GOWN STRL REUS W/TWL XL LVL3 (GOWN DISPOSABLE) ×2
GUIDEWIRE ANG ZIPWIRE 038X150 (WIRE) IMPLANT
GUIDEWIRE STR DUAL SENSOR (WIRE) ×3 IMPLANT
IV NS IRRIG 3000ML ARTHROMATIC (IV SOLUTION) ×3 IMPLANT
PACK CYSTO (CUSTOM PROCEDURE TRAY) ×3 IMPLANT
SYRINGE 10CC LL (SYRINGE) ×3 IMPLANT
TUBE FEEDING 8FR 16IN STR KANG (MISCELLANEOUS) IMPLANT

## 2014-12-22 NOTE — Anesthesia Preprocedure Evaluation (Addendum)
Anesthesia Evaluation  Patient identified by MRN, date of birth, ID band Patient awake    Reviewed: Allergy & Precautions, H&P , NPO status , Patient's Chart, lab work & pertinent test results  Airway Mallampati: II  TM Distance: >3 FB Neck ROM: Full   Comment: Piercing's lower lip Dental no notable dental hx. (+) Dental Advisory Given, Teeth Intact,    Pulmonary neg pulmonary ROS,  breath sounds clear to auscultation  Pulmonary exam normal       Cardiovascular Exercise Tolerance: Good negative cardio ROS  Rhythm:Regular Rate:Normal     Neuro/Psych Anxiety Depression Bipolar Disorder negative neurological ROS     GI/Hepatic negative GI ROS, Neg liver ROS,   Endo/Other  negative endocrine ROS  Renal/GU negative Renal ROS  negative genitourinary   Musculoskeletal negative musculoskeletal ROS (+)   Abdominal   Peds negative pediatric ROS (+)  Hematology negative hematology ROS (+)   Anesthesia Other Findings   Reproductive/Obstetrics negative OB ROS                            Anesthesia Physical  Anesthesia Plan  ASA: II  Anesthesia Plan: General   Post-op Pain Management:    Induction: Intravenous  Airway Management Planned: LMA  Additional Equipment:   Intra-op Plan:   Post-operative Plan:   Informed Consent: I have reviewed the patients History and Physical, chart, labs and discussed the procedure including the risks, benefits and alternatives for the proposed anesthesia with the patient or authorized representative who has indicated his/her understanding and acceptance.   Dental advisory given  Plan Discussed with: CRNA  Anesthesia Plan Comments:         Anesthesia Quick Evaluation

## 2014-12-22 NOTE — Brief Op Note (Signed)
12/22/2014  12:29 PM  PATIENT:  Meredith LeedsSean Bowdish  23 y.o. male  PRE-OPERATIVE DIAGNOSIS:  RETAINED STENT   POST-OPERATIVE DIAGNOSIS:  * No post-op diagnosis entered *  PROCEDURE:  Procedure(s): CYSTOSCOPY WITH LEFT STENT REMOVAL (Left)  SURGEON:  Surgeon(s) and Role:    * Sebastian Acheheodore Stran Raper, MD - Primary  PHYSICIAN ASSISTANT:   ASSISTANTS: none   ANESTHESIA:   MAC  EBL:     BLOOD ADMINISTERED:none  DRAINS: none   LOCAL MEDICATIONS USED:  NONE  SPECIMEN:  No Specimen  DISPOSITION OF SPECIMEN:  N/A  COUNTS:  YES  TOURNIQUET:  * No tourniquets in log *  DICTATION: .Other Dictation: Dictation Number [id number]  PLAN OF CARE: Discharge to home after PACU  PATIENT DISPOSITION:  PACU - hemodynamically stable.   Delay start of Pharmacological VTE agent (>24hrs) due to surgical blood loss or risk of bleeding: yes

## 2014-12-22 NOTE — Transfer of Care (Signed)
Immediate Anesthesia Transfer of Care Note  Patient: Douglas Bailey  Procedure(s) Performed: Procedure(s): CYSTOSCOPY WITH LEFT STENT REMOVAL (Left)  Patient Location: PACU  Anesthesia Type:MAC  Level of Consciousness: awake, alert , oriented and patient cooperative  Airway & Oxygen Therapy: Patient Spontanous Breathing and Patient connected to nasal cannula oxygen  Post-op Assessment: Report given to PACU RN and Post -op Vital signs reviewed and stable  Post vital signs: Reviewed and stable  Complications: No apparent anesthesia complications

## 2014-12-22 NOTE — Anesthesia Procedure Notes (Signed)
Procedure Name: MAC Date/Time: 12/22/2014 12:20 PM Performed by: Tyrone NineSAUVE, Shalondra Wunschel F Pre-anesthesia Checklist: Patient identified, Timeout performed, Emergency Drugs available, Suction available and Patient being monitored Patient Re-evaluated:Patient Re-evaluated prior to inductionOxygen Delivery Method: Nasal cannula Intubation Type: IV induction

## 2014-12-22 NOTE — H&P (Signed)
Douglas LeedsSean Bailey is an 23 y.o. male.    Chief Complaint: Pre-Op Left Ureteral Stent Removal  HPI:   1 - Left Ureteral Stone - s/p left ureteral stent placement 12/21 as temporizing measure for acute left ureteral colic with fever by Dr. Annabell HowellsWrenn. Subsequently underwent left SWL 12/24 by Dr. Annabell HowellsWrenn as no interval fevers. Most recent UCX negative 12/26.  Today Douglas Bailey presents for removal of his left ureteral stent. He cannot tolerate office removal given his significant anxiety.   Past Medical History  Diagnosis Date  . Depression   . Bipolar disorder   . Anxiety   . History of kidney stones 2015  . Nausea   . Retained ureteral stent 12/21  . Dysuria   . Hematuria     Past Surgical History  Procedure Laterality Date  . Mouth surgery Bilateral 2013  . Cystoscopy w/ ureteral stent placement Left 12/15/14  . Cystoscopy with retrograde pyelogram, ureteroscopy and stent placement Left 12/15/2014    Procedure: CYSTOSCOPY STENT PLACEMENT left ureter;  Surgeon: Anner CreteJohn J Wrenn, MD;  Location: [facility];  Service: Urology;  Laterality: Left;    History reviewed. No pertinent family history. Social History:  reports that he has never smoked. He does not have any smokeless tobacco history on file. He reports that he does not drink alcohol or use illicit drugs.  Allergies: No Known Allergies  No prescriptions prior to admission    No results found for this or any previous visit (from the past 48 hour(s)). Dg Abd 1 View  12/20/2014   CLINICAL DATA:  Left-sided abdominal pain for 24 hr with nausea and constipation. Diagnosis with kidney stone 1 week ago with placement of ureteral stent.  EXAM: ABDOMEN - 1 VIEW  COMPARISON:  12/18/2014  FINDINGS: Double-J left-sided internal ureteral stent is unchanged and in adequate position. There is an approximate 4 mm calcific density adjacent the stent in the region of the proximal to mid ureter as this apparent stone is slightly more distal compared to the prior exam.  Bowel gas pattern is nonobstructive with mild fecal retention throughout the colon. Remainder of the exam is unchanged.  IMPRESSION: Nonobstructive bowel gas pattern with mild fecal retention throughout the colon.  Left-sided double-J internal ureteral stent in adequate position. 4 mm calcific density adjacent the stent over the proximal to mid ureter with only slight distal movement compared to the previous exam.   Electronically Signed   By: Elberta Fortisaniel  Boyle M.D.   On: 12/20/2014 08:45   [name]  12/20/2014   CLINICAL DATA:  Left flank pain  EXAM: RENAL/URINARY TRACT ULTRASOUND COMPLETE  COMPARISON:  12/11/2014  FINDINGS: Right Kidney:  Length: 9.7 cm in length. No hydronephrosis. 2.5 x 2.3 x 2.2 cm central soft tissue mass versus column of Bertin.  Left Kidney:  Length: 10.9 cm in length. No mass. Normal echogenicity. Mild hydronephrosis.  Bladder:  2.4 cm mass at the base of the bladder with some internal vascularity.  IMPRESSION: 2.5 cm possible mass in the right kidney. It may simply represent a column of Bertin, a normal variant. MR with contrast is recommended.  2.4 cm mass in the bladder. Transitional cell carcinoma is not excluded. Correlate with cystoscopy.  Left hydronephrosis.   Electronically Signed   By: Maryclare BeanArt  Hoss M.D.   On: 12/20/2014 10:58    Review of Systems  Constitutional: Negative.  Negative for fever and chills.  HENT: Negative.   Eyes: Negative.   Respiratory: Negative.   Cardiovascular: Negative.  Gastrointestinal: Negative.   Genitourinary: Positive for flank pain.  Musculoskeletal: Negative.   Skin: Negative.   Neurological: Negative.   Endo/Heme/Allergies: Negative.   Psychiatric/Behavioral: Negative.     There were no vitals taken for this visit. Physical Exam  Constitutional: He appears well-developed.  HENT:  Head: Normocephalic.  Eyes: Pupils are equal, round, and reactive to light.  Neck: Normal range of motion.  Cardiovascular: Normal rate.    Respiratory: Effort normal.  GI: Soft.  Genitourinary: Penis normal.  Musculoskeletal: Normal range of motion.  Neurological: He is alert.  Skin: Skin is warm.  Psychiatric: He has a normal mood and affect.     Assessment/Plan  1 - Left Ureteral Stone - now s/p SWL. We discussed risks of stent pull including bleeding, infection, damage to kidney / ureter / bladder as well as some element of persistent colic from edema and continued small fragment passage.   Douglas Bailey 12/22/2014, 6:01 AM

## 2014-12-22 NOTE — Anesthesia Postprocedure Evaluation (Signed)
  Anesthesia Post-op Note  Patient: Douglas LeedsSean Cottman  Procedure(s) Performed: Procedure(s) (LRB): CYSTOSCOPY WITH LEFT STENT REMOVAL (Left)  Patient Location: PACU  Anesthesia Type: MAC  Level of Consciousness: awake and alert   Airway and Oxygen Therapy: Patient Spontanous Breathing  Post-op Pain: mild  Post-op Assessment: Post-op Vital signs reviewed, Patient's Cardiovascular Status Stable, Respiratory Function Stable, Patent Airway and No signs of Nausea or vomiting  Last Vitals:  Filed Vitals:   12/22/14 1331  BP: 110/70  Pulse: 83  Temp: 36.5 C  Resp: 14    Post-op Vital Signs: stable   Complications: No apparent anesthesia complications

## 2014-12-22 NOTE — Discharge Instructions (Signed)
1 - You may have urinary urgency (bladder spasms) and bloody urine on / off with passage continued small stone material for several days. This is normal.  2 - Call MD or go to ER for fever >102, severe pain / nausea / vomiting not relieved by medications, or acute change in medical status   CYSTOSCOPY HOME CARE INSTRUCTIONS  Activity: Rest for the remainder of the day.  Do not drive or operate equipment today.  You may resume normal activities in one to two days as instructed by your physician.   Meals: Drink plenty of liquids and eat light foods such as gelatin or soup this evening.  You may return to a normal meal plan tomorrow.  Return to Work: You may return to work in one to two days or as instructed by your physician.  Special Instructions / Symptoms: Call your physician if any of these symptoms occur:   -persistent or heavy bleeding  -bleeding which continues after first few urination  -large blood clots that are difficult to pass  -urine stream diminishes or stops completely  -fever equal to or higher than 101 degrees Farenheit.  -cloudy urine with a strong, foul odor  -severe pain  You may feel some burning pain when you urinate.  This should disappear with time.  Applying moist heat to the lower abdomen or a hot tub bath may help relieve the pain. \    Post Anesthesia Home Care Instructions  Activity: Get plenty of rest for the remainder of the day. A responsible adult should stay with you for 24 hours following the procedure.  For the next 24 hours, DO NOT: -Drive a car -Advertising copywriterperate machinery -Drink alcoholic beverages -Take any medication unless instructed by your physician -Make any legal decisions or sign important papers.  Meals: Start with liquid foods such as gelatin or soup. Progress to regular foods as tolerated. Avoid greasy, spicy, heavy foods. If nausea and/or vomiting occur, drink only clear liquids until the nausea and/or vomiting subsides. Call your  physician if vomiting continues.  Special Instructions/Symptoms: Your throat may feel dry or sore from the anesthesia or the breathing tube placed in your throat during surgery. If this causes discomfort, gargle with warm salt water. The discomfort should disappear within 24 hours.

## 2014-12-23 ENCOUNTER — Encounter (HOSPITAL_BASED_OUTPATIENT_CLINIC_OR_DEPARTMENT_OTHER): Payer: Self-pay | Admitting: Urology

## 2014-12-23 NOTE — Op Note (Signed)
NAMAllison Quarry:  Paris, Larence                  ACCOUNT NO.:  [id number]  MEDICAL RECORD NO.:  [id number]  LOCATION:                                 FACILITY:  PHYSICIAN:  Sebastian Acheheodore Felder Lebeda, MD     DATE OF BIRTH:  08/14/1991  DATE OF PROCEDURE:  12/22/2014 DATE OF DISCHARGE:  12/22/2014                              OPERATIVE REPORT   PREOPERATIVE DIAGNOSIS:  Retained left ureteral stent.  POSTOPERATIVE DIAGNOSIS:  Retained left ureteral stent.  PROCEDURE:  Cystoscopy with removal of left ureteral stent.  SURGEON:  Sebastian Acheheodore Youcef Klas, MD  ESTIMATED BLOOD LOSS:  Nil.  COMPLICATIONS:  None.  SPECIMENS:  Left ureteral stent intact for discard.  INDICATIONS:  Mr. Lonni Fixolan is a 23 year old young man with history of a recent left renal colic secondary to ureteral stone.  He underwent initial temporizing measure with left ureteral stent on December 16, 2014.  As he had a fever, specimen culture was negative, on Cipro.  He underwent left shockwave lithotripsy on December 18, 2014, with my colleague, Dr. Annabell HowellsWrenn and now presents for removal of this retained stent.  Notably, the patient cannot tolerate office cystoscopy, due to his young age and very significant anxiety.  Informed consent was obtained and placed in medical record.  PROCEDURE IN DETAIL:  The patient being Meredith LeedsSean Rison, was verified. Procedure being cyst removal, which was confirmed.  Procedure was carried out.  Time-out was performed.  Intravenous antibiotics were administered.  Sterile field was created by prepping and draping the patient's penis using iodine and the sterile field was toweled off. Next, flexible cystourethroscopy was performed using  16- French flexible cystoscope.  Anterior and posterior urethra unremarkable.  Inspection of the urinary bladder revealed some edema and erythema at the area of the left ureteral orifice consistent with likely recent stent which was  grasped at its distal curl and brought out in its entirety,  set aside for discard.  Procedure then terminated.  The patient tolerated the procedure well.  It was performed under monitored anesthesia care.         ______________________________ Sebastian Acheheodore Jarrah Babich, MD    TM/MEDQ  D:  12/22/2014  T:  12/22/2014  Job:  [id number]

## 2015-01-08 ENCOUNTER — Encounter (HOSPITAL_COMMUNITY): Payer: Self-pay | Admitting: Urology

## 2016-12-28 ENCOUNTER — Emergency Department (HOSPITAL_COMMUNITY)
Admission: EM | Admit: 2016-12-28 | Discharge: 2016-12-28 | Disposition: A | Discharge status: Home / Self Care | Payer: Medicaid Other | Attending: Dermatology | Admitting: Dermatology

## 2016-12-28 ENCOUNTER — Encounter (HOSPITAL_COMMUNITY): Payer: Self-pay | Admitting: *Deleted

## 2016-12-28 DIAGNOSIS — J029 Acute pharyngitis, unspecified: Secondary | ICD-10-CM | POA: Insufficient documentation

## 2016-12-28 DIAGNOSIS — Z5321 Procedure and treatment not carried out due to patient leaving prior to being seen by health care provider: Secondary | ICD-10-CM | POA: Diagnosis not present

## 2016-12-28 DIAGNOSIS — R05 Cough: Secondary | ICD-10-CM | POA: Diagnosis present

## 2016-12-28 NOTE — ED Notes (Signed)
Patient left the ED after being triaged and did not see a provider.  Patient did not notify staff of his departure.

## 2016-12-28 NOTE — ED Triage Notes (Signed)
Patient is alert and oriented x4.  He is complaining of a cough with a sore throat and a productive cough.  Currently he rates his pain 2 of 10 in his throat.  He states that he has had fevers but has no thermometer to take his temperature.

## 2016-12-29 ENCOUNTER — Encounter (HOSPITAL_COMMUNITY): Payer: Self-pay | Admitting: Emergency Medicine

## 2016-12-29 ENCOUNTER — Emergency Department (HOSPITAL_COMMUNITY)
Admission: EM | Admit: 2016-12-29 | Discharge: 2016-12-30 | Disposition: A | Discharge status: Home / Self Care | Payer: Medicaid Other | Attending: Emergency Medicine | Admitting: Emergency Medicine

## 2016-12-29 DIAGNOSIS — J0191 Acute recurrent sinusitis, unspecified: Secondary | ICD-10-CM | POA: Insufficient documentation

## 2016-12-29 DIAGNOSIS — J029 Acute pharyngitis, unspecified: Secondary | ICD-10-CM | POA: Diagnosis present

## 2016-12-29 DIAGNOSIS — F909 Attention-deficit hyperactivity disorder, unspecified type: Secondary | ICD-10-CM | POA: Insufficient documentation

## 2016-12-29 DIAGNOSIS — J019 Acute sinusitis, unspecified: Secondary | ICD-10-CM

## 2016-12-29 NOTE — ED Notes (Signed)
Pt states "hospitals make me nervous"

## 2016-12-29 NOTE — ED Triage Notes (Signed)
Pt states "for the past week i've had a sore throat, running nose, headache"

## 2016-12-29 NOTE — ED Provider Notes (Signed)
[facility]EMERGENCY DEPT Provider Note   CSN: [id number] Arrival date & time: 12/29/16  2232  By signing my name below, I, Douglas Bailey, attest that this documentation has been prepared under the direction and in the presence of Douglas PorterMark Merrell Rettinger, MD . Electronically Signed: Modena JanskyAlbert Bailey, Scribe. 12/29/2016. 11:57 PM.  History   Chief Complaint Chief Complaint  Patient presents with  . Sore Throat   The history is provided by the patient. No language interpreter was used.   HPI Comments: Douglas LeedsSean Bailey is a 26 y.o. male who presents to the Emergency Department complaining of constant moderate sore throat that started a few days ago. He states he has been having gradually worsening URI-like symptoms with no modifying factors. He reports associated symptoms of sinus pressure, rhinorrhea, headache, subjective fever, mild ear pain, and intermittent cough. Pt's temperature in the ED today was 99.9. He denies any hx of tonsillectomy, SOB or other complaints.     PCP: No PCP Per Patient  Past Medical History:  Diagnosis Date  . ADHD   . Anxiety   . Bipolar disorder (HCC)   . Depression   . Dysuria   . Hematuria   . History of kidney stones 2015  . Nausea   . 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 Hypokalemia 01/05/2017  . Hyperglycemia 01/05/2017  . Bipolar disorder (HCC) 01/05/2017  . 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 Acute pericarditis 01/04/2017    Past Surgical History:  Procedure Laterality Date  . CYSTOSCOPY W/ URETERAL STENT PLACEMENT Left 12/15/14  . CYSTOSCOPY W/ URETERAL STENT REMOVAL Left 12/22/2014   Procedure: CYSTOSCOPY WITH LEFT STENT REMOVAL;  Surgeon: Sebastian Acheheodore Manny, MD;  [facility];  Service: Urology;  Laterality: Left;  . CYSTOSCOPY WITH RETROGRADE PYELOGRAM, URETEROSCOPY AND STENT PLACEMENT Left 12/15/2014   Procedure: CYSTOSCOPY STENT PLACEMENT left ureter;  Surgeon: Anner CreteJohn J Wrenn, MD;   Location: [facility];  Service: Urology;  Laterality: Left;  . MOUTH SURGERY Bilateral 2013       Home Medications    Prior to Admission medications   Medication Sig Start Date End Date Taking? Authorizing Provider  ARIPiprazole (ABILIFY) 5 MG tablet Take 5 mg by mouth at bedtime.    Historical Provider, MD  citalopram (CELEXA) 20 MG tablet Take 30 mg by mouth daily.    Historical Provider, MD  clonazePAM (KLONOPIN) 0.5 MG tablet Take 0.5 mg by mouth 2 (two) times daily as needed for anxiety.    Historical Provider, MD  colchicine 0.6 MG tablet Take 1 tablet (0.6 mg total) by mouth daily. 01/06/17   Douglas CantorSaima Rizwan, MD  dexmethylphenidate (FOCALIN XR) 10 MG 24 hr capsule Take 10 mg by mouth every morning.    Historical Provider, MD  ibuprofen (ADVIL,MOTRIN) 600 MG tablet Take 1 tablet (600 mg total) by mouth every 8 (eight) hours. 01/05/17   Douglas CantorSaima Rizwan, MD  lamoTRIgine (LAMICTAL) 100 MG tablet Take 100 mg by mouth at bedtime.    Historical Provider, MD    Family History No family history on file.  Social History Social History  Substance Use Topics  . Smoking status: Never Smoker  . Smokeless tobacco: Never Used  . Alcohol use No     Allergies   Patient has no known allergies.   Review of Systems Review of Systems  Constitutional: Positive for fever (Subjective). Negative for appetite change, chills, diaphoresis and fatigue.  HENT: Positive for  ear pain (Mild), rhinorrhea, sinus pressure and sore throat. Negative for mouth sores and trouble swallowing.   Eyes: Negative for visual disturbance.  Respiratory: Positive for cough. Negative for chest tightness, shortness of breath and wheezing.   Cardiovascular: Negative for chest pain.  Gastrointestinal: Negative for abdominal distention, abdominal pain, diarrhea, nausea and vomiting.  Endocrine: Negative for polydipsia, polyphagia and polyuria.  Genitourinary: Negative for dysuria, frequency and hematuria.  Musculoskeletal:  Negative for gait problem.  Skin: Negative for color change, pallor and rash.  Neurological: Positive for headaches. Negative for dizziness, syncope and light-headedness.  Hematological: Does not bruise/bleed easily.  Psychiatric/Behavioral: Negative for behavioral problems and confusion.     Physical Exam Updated Vital Signs BP 154/98   Pulse 116   Temp 99.9 F (37.7 C) (Oral)   Resp 18   SpO2 99%   Physical Exam  Constitutional: He is oriented to person, place, and time. He appears well-developed and well-nourished. No distress.  HENT:  Head: Normocephalic.  Eyes: Conjunctivae are normal. Pupils are equal, round, and reactive to light. No scleral icterus.  Neck: Normal range of motion. Neck supple. No thyromegaly present.  Cardiovascular: Normal rate and regular rhythm.  Exam reveals no gallop and no friction rub.   No murmur heard. Pulmonary/Chest: Effort normal and breath sounds normal. No respiratory distress. He has no wheezes. He has no rales.  Abdominal: Soft. Bowel sounds are normal. He exhibits no distension. There is no tenderness. There is no rebound.  Musculoskeletal: Normal range of motion.  Neurological: He is alert and oriented to person, place, and time.  Skin: Skin is warm and dry. No rash noted.  Psychiatric: He has a normal mood and affect. His behavior is normal.  Nursing note and vitals reviewed.    ED Treatments / Results  DIAGNOSTIC STUDIES: Oxygen Saturation is 99% on RA, normal by my interpretation.    COORDINATION OF CARE: 12:01 PM- Pt advised of plan for treatment and pt agrees.  Labs (all labs ordered are listed, but only abnormal results are displayed) Labs Reviewed - No data to display  EKG  EKG Interpretation None       Radiology No results found.  Procedures Procedures (including critical care time)  Medications Ordered in ED Medications - No data to display   Initial Impression / Assessment and Plan / ED Course  I have  reviewed the triage vital signs and the nursing notes.  Pertinent labs & imaging results that were available during my care of the patient were reviewed by me and considered in my medical decision making (see chart for details).  Clinical Course      Final Clinical Impressions(s) / ED Diagnoses   Final diagnoses:  Acute non-recurrent sinusitis, unspecified location    New Prescriptions Discharge Medication List as of 12/30/2016 12:01 AM    START taking these medications   Details  amoxicillin (AMOXIL) 500 MG capsule Take 1 capsule (500 mg total) by mouth 3 (three) times daily., Starting Fri 12/30/2016, Print       I personally performed the services described in this documentation, which was scribed in my presence. The recorded information has been reviewed and is accurate.    Douglas Porter, MD 01/07/17 [phone]

## 2016-12-30 MED ORDER — AMOXICILLIN 500 MG PO CAPS: 500.0000 mg | ORAL_CAPSULE | Freq: Three times a day (TID) | ORAL | 0 refills | Status: DC

## 2016-12-30 NOTE — ED Notes (Signed)
Pt unable to sign sig pad due to no availability on mobile computer

## 2016-12-30 NOTE — Discharge Instructions (Signed)
Take Claritin or Zyrtec daily to decrease secretions.  Sudafed a.m., and early p.m. for decongestant.  Prescription for amoxicillin 10 days.

## 2017-01-04 ENCOUNTER — Emergency Department (HOSPITAL_COMMUNITY): Payer: Medicaid Other

## 2017-01-04 ENCOUNTER — Observation Stay (HOSPITAL_COMMUNITY)
Admission: EM | Admit: 2017-01-04 | Discharge: 2017-01-05 | Disposition: A | Discharge status: Home / Self Care | Payer: Medicaid Other | Attending: Internal Medicine | Admitting: Internal Medicine

## 2017-01-04 ENCOUNTER — Encounter (HOSPITAL_COMMUNITY): Payer: Self-pay | Admitting: *Deleted

## 2017-01-04 DIAGNOSIS — J329 Chronic sinusitis, unspecified: Secondary | ICD-10-CM | POA: Insufficient documentation

## 2017-01-04 DIAGNOSIS — R000 Tachycardia, unspecified: Secondary | ICD-10-CM | POA: Diagnosis not present

## 2017-01-04 DIAGNOSIS — F419 Anxiety disorder, unspecified: Secondary | ICD-10-CM | POA: Diagnosis not present

## 2017-01-04 DIAGNOSIS — F909 Attention-deficit hyperactivity disorder, unspecified type: Secondary | ICD-10-CM | POA: Diagnosis not present

## 2017-01-04 DIAGNOSIS — F319 Bipolar disorder, unspecified: Secondary | ICD-10-CM | POA: Diagnosis not present

## 2017-01-04 DIAGNOSIS — I301 Infective pericarditis: Secondary | ICD-10-CM

## 2017-01-04 DIAGNOSIS — E876 Hypokalemia: Secondary | ICD-10-CM | POA: Diagnosis present

## 2017-01-04 DIAGNOSIS — I774 Celiac artery compression syndrome: Secondary | ICD-10-CM | POA: Diagnosis not present

## 2017-01-04 DIAGNOSIS — J069 Acute upper respiratory infection, unspecified: Secondary | ICD-10-CM | POA: Diagnosis not present

## 2017-01-04 DIAGNOSIS — R739 Hyperglycemia, unspecified: Secondary | ICD-10-CM | POA: Diagnosis present

## 2017-01-04 DIAGNOSIS — Z9119 Patient's noncompliance with other medical treatment and regimen: Secondary | ICD-10-CM | POA: Diagnosis not present

## 2017-01-04 DIAGNOSIS — I771 Stricture of artery: Secondary | ICD-10-CM

## 2017-01-04 DIAGNOSIS — Z87442 Personal history of urinary calculi: Secondary | ICD-10-CM | POA: Insufficient documentation

## 2017-01-04 DIAGNOSIS — I309 Acute pericarditis, unspecified: Secondary | ICD-10-CM | POA: Diagnosis not present

## 2017-01-04 HISTORY — DX: Attention-deficit hyperactivity disorder, unspecified type: F90.9

## 2017-01-04 LAB — I-STAT CHEM 8, ED
BUN: 10 mg/dL (ref 6–20)
CHLORIDE: 106 mmol/L (ref 101–111)
CREATININE: 0.6 mg/dL — AB (ref 0.61–1.24)
Calcium, Ion: 1.14 mmol/L — ABNORMAL LOW (ref 1.15–1.40)
GLUCOSE: 120 mg/dL — AB (ref 65–99)
HCT: 45 % (ref 39.0–52.0)
Hemoglobin: 15.3 g/dL (ref 13.0–17.0)
POTASSIUM: 3.2 mmol/L — AB (ref 3.5–5.1)
Sodium: 142 mmol/L (ref 135–145)
TCO2: 23 mmol/L (ref 0–100)

## 2017-01-04 LAB — CBC WITH DIFFERENTIAL/PLATELET
Basophils Absolute: 0 10*3/uL (ref 0.0–0.1)
Basophils Relative: 0 %
EOS PCT: 0 %
Eosinophils Absolute: 0 10*3/uL (ref 0.0–0.7)
HEMATOCRIT: 44 % (ref 39.0–52.0)
HEMOGLOBIN: 15.5 g/dL (ref 13.0–17.0)
LYMPHS ABS: 1.2 10*3/uL (ref 0.7–4.0)
LYMPHS PCT: 10 %
MCH: 30 pg (ref 26.0–34.0)
MCHC: 35.2 g/dL (ref 30.0–36.0)
MCV: 85.3 fL (ref 78.0–100.0)
Monocytes Absolute: 0.4 10*3/uL (ref 0.1–1.0)
Monocytes Relative: 3 %
NEUTROS PCT: 87 %
Neutro Abs: 10.4 10*3/uL — ABNORMAL HIGH (ref 1.7–7.7)
Platelets: 508 10*3/uL — ABNORMAL HIGH (ref 150–400)
RBC: 5.16 MIL/uL (ref 4.22–5.81)
RDW: 11.8 % (ref 11.5–15.5)
WBC: 12.1 10*3/uL — AB (ref 4.0–10.5)

## 2017-01-04 LAB — RAPID URINE DRUG SCREEN, HOSP PERFORMED
AMPHETAMINES: NOT DETECTED
Barbiturates: NOT DETECTED
Benzodiazepines: NOT DETECTED
COCAINE: NOT DETECTED
OPIATES: NOT DETECTED
TETRAHYDROCANNABINOL: NOT DETECTED

## 2017-01-04 LAB — D-DIMER, QUANTITATIVE: D-Dimer, Quant: <0.27 ug/mL-FEU (ref 0.00–0.50)

## 2017-01-04 LAB — TROPONIN I: Troponin I: <0.03 ng/mL (ref <0.03)

## 2017-01-04 LAB — MAGNESIUM: Magnesium: 1.8 mg/dL (ref 1.7–2.4)

## 2017-01-04 LAB — TSH: TSH: 0.533 u[IU]/mL (ref 0.350–4.500)

## 2017-01-04 LAB — SEDIMENTATION RATE: Sed Rate: 18 mm/hr — ABNORMAL HIGH (ref 0–16)

## 2017-01-04 MED ORDER — LORAZEPAM 2 MG/ML IJ SOLN
1.0000 mg | Freq: Once | INTRAMUSCULAR | Status: AC
  Administered 2017-01-04: 1 mg via INTRAVENOUS
  Filled 2017-01-04: qty 1

## 2017-01-04 MED ORDER — POTASSIUM CHLORIDE CRYS ER 20 MEQ PO TBCR
40.0000 meq | EXTENDED_RELEASE_TABLET | Freq: Once | ORAL | Status: AC
  Administered 2017-01-04: 40 meq via ORAL
  Filled 2017-01-04: qty 2

## 2017-01-04 MED ORDER — IOPAMIDOL (ISOVUE-370) INJECTION 76%
INTRAVENOUS | Status: AC
  Administered 2017-01-04: 100 mL
  Filled 2017-01-04: qty 100

## 2017-01-04 NOTE — ED Provider Notes (Signed)
[facility]-EMERGENCY DEPT Provider Note   CSN: [id number] Arrival date & time: 01/04/17  1444   By signing my name below, I, Teofilo Pod, attest that this documentation has been prepared under the direction and in the presence of Derwood Kaplan, MD . Electronically Signed: Teofilo Pod, ED Scribe. 01/04/2017. 5:49 PM.   History   Chief Complaint Chief Complaint  Patient presents with  . Cough  . Chest Pain   The history is provided by the patient. No language interpreter was used.   HPI Comments:  Douglas Bailey is a 26 y.o. male who presents to the Emergency Department complaining of intermittent chest pain x 4 days. Pt states that the chest pain is brought on by coughing, deep breathing, and laying down. Pt complains of associated productive cough and diaphoresis. Pt reports that he started a course of amoxicillin for a sinus infection 5 days ago. Pt states that he had a fever before onset of chest pain, but this has since resolved. Pt reports sick contact, father had similar symptoms 2 days ago. Pt states that he is otherwise healthy and has been eating/drinking normally. Pt denies hx of DVT/PE, illicit drug use, long travel. Pt denies wheezing.     Past Medical History:  Diagnosis Date  . ADHD   . Anxiety   . Bipolar disorder (HCC)   . Depression   . Dysuria   . Hematuria   . History of kidney stones 2015  . Nausea   . 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 Hypokalemia 01/05/2017  . Hyperglycemia 01/05/2017  . Bipolar disorder (HCC) 01/05/2017  . 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 Acute pericarditis 01/04/2017    Past Surgical History:  Procedure Laterality Date  . CYSTOSCOPY W/ URETERAL STENT PLACEMENT Left 12/15/14  . CYSTOSCOPY W/ URETERAL STENT REMOVAL Left 12/22/2014   Procedure: CYSTOSCOPY WITH LEFT STENT REMOVAL;  Surgeon: Sebastian Ache, MD;  Location: [facility];  Service:  Urology;  Laterality: Left;  . CYSTOSCOPY WITH RETROGRADE PYELOGRAM, URETEROSCOPY AND STENT PLACEMENT Left 12/15/2014   Procedure: CYSTOSCOPY STENT PLACEMENT left ureter;  Surgeon: Anner Crete, MD;  Location: [facility];  Service: Urology;  Laterality: Left;  . MOUTH SURGERY Bilateral 2013       Home Medications    Prior to Admission medications   Medication Sig Start Date End Date Taking? Authorizing Provider  ARIPiprazole (ABILIFY) 5 MG tablet Take 5 mg by mouth at bedtime.   Yes Historical Provider, MD  citalopram (CELEXA) 20 MG tablet Take 30 mg by mouth daily.   Yes Historical Provider, MD  clonazePAM (KLONOPIN) 0.5 MG tablet Take 0.5 mg by mouth 2 (two) times daily as needed for anxiety.   Yes Historical Provider, MD  dexmethylphenidate (FOCALIN XR) 10 MG 24 hr capsule Take 10 mg by mouth every morning.   Yes Historical Provider, MD  lamoTRIgine (LAMICTAL) 100 MG tablet Take 100 mg by mouth at bedtime.   Yes Historical Provider, MD  colchicine 0.6 MG tablet Take 1 tablet (0.6 mg total) by mouth daily. 01/06/17   Calvert Cantor, MD  ibuprofen (ADVIL,MOTRIN) 600 MG tablet Take 1 tablet (600 mg total) by mouth every 8 (eight) hours. 01/05/17   Calvert Cantor, MD    Family History History reviewed. No pertinent family history.  Social History Social History  Substance Use Topics  . Smoking status: Never Smoker  . Smokeless tobacco: Never  Used  . Alcohol use No     Allergies   Patient has no known allergies.   Review of Systems Review of Systems  Constitutional: Positive for diaphoresis.  Respiratory: Positive for cough. Negative for wheezing.   Cardiovascular: Positive for chest pain.  All other systems reviewed and are negative.    Physical Exam Updated Vital Signs BP 106/67 (BP Location: Left Arm)   Pulse (!) 117   Temp 98.4 F (36.9 C) (Oral)   Resp 18   Ht 5\' 8"  (1.727 m)   Wt 123 lb 14.4 oz (56.2 kg)   SpO2 100%   BMI 18.84 kg/m   Physical Exam    Constitutional: He appears well-developed and well-nourished. No distress.  HENT:  Head: Normocephalic and atraumatic.  Eyes: Conjunctivae are normal.  Cardiovascular: Normal rate.   Pulmonary/Chest: Effort normal.  Abdominal: He exhibits no distension.  Neurological: He is alert.  Skin: Skin is warm and dry.  Psychiatric: He has a normal mood and affect.  Nursing note and vitals reviewed.    ED Treatments / Results  DIAGNOSTIC STUDIES:  Oxygen Saturation is 100% on RA, normal by my interpretation.    COORDINATION OF CARE:  5:49 PM Discussed treatment plan with pt at bedside and pt agreed to plan.   Labs (all labs ordered are listed, but only abnormal results are displayed) Labs Reviewed  CBC WITH DIFFERENTIAL/PLATELET - Abnormal; Notable for the following:       Result Value   WBC 12.1 (*)    Platelets 508 (*)    Neutro Abs 10.4 (*)    All other components within normal limits  SEDIMENTATION RATE - Abnormal; Notable for the following:    Sed Rate 18 (*)    All other components within normal limits  CBC - Abnormal; Notable for the following:    Platelets 430 (*)    All other components within normal limits  SEDIMENTATION RATE - Abnormal; Notable for the following:    Sed Rate 20 (*)    All other components within normal limits  BASIC METABOLIC PANEL - Abnormal; Notable for the following:    Glucose, Bld 124 (*)    Creatinine, Ser 0.49 (*)    All other components within normal limits  I-STAT CHEM 8, ED - Abnormal; Notable for the following:    Potassium 3.2 (*)    Creatinine, Ser 0.60 (*)    Glucose, Bld 120 (*)    Calcium, Ion 1.14 (*)    All other components within normal limits  D-DIMER, QUANTITATIVE (NOT AT [facility])  TROPONIN I  MAGNESIUM  TSH  RAPID URINE DRUG SCREEN, HOSP PERFORMED  HIV ANTIBODY (ROUTINE TESTING)  TSH  TROPONIN I  TROPONIN I  C-REACTIVE PROTEIN    EKG  EKG Interpretation  Date/Time:  Wednesday January 04 2017 22:27:21  EST Ventricular Rate:  138 PR Interval:    QRS Duration: 82 QT Interval:  275 QTC Calculation: 417 R Axis:   33 Text Interpretation:  Sinus tachycardia LAE, consider biatrial enlargement RSR' in V1 or V2, probably normal variant Nonspecific T abnormalities, diffuse leads Confirmed by [facility] MD, JULIE ([id number]) on 01/05/2017 3:41:47 PM       Radiology Dg Chest 2 View  Result Date: 01/04/2017 CLINICAL DATA:  cough/cp EXAM: CHEST  2 VIEW COMPARISON:  06/17/2008. FINDINGS: Pectus deformity noted. Mediastinum and hilar structures normal. Lungs are clear. Heart size normal. No pleural effusion or pneumothorax. No acute bony abnormality. IMPRESSION: No acute cardiopulmonary disease.  Electronically Signed   By: Maisie Fus  Register   On: 01/04/2017 15:21   Ct Angio Chest Pe W And/or Wo Contrast  Result Date: 01/04/2017 CLINICAL DATA:  Acute onset of generalized chest pain. Initial encounter. EXAM: CT ANGIOGRAPHY CHEST WITH CONTRAST TECHNIQUE: Multidetector CT imaging of the chest was performed using the standard protocol during bolus administration of intravenous contrast. Multiplanar CT image reconstructions and MIPs were obtained to evaluate the vascular anatomy. CONTRAST:  80 mL of Isovue 370 IV contrast COMPARISON:  Chest radiograph performed earlier today at 2:57 p.m. FINDINGS: Cardiovascular:  There is no evidence of pulmonary embolus. The heart is normal in size. The thoracic aorta is unremarkable. No calcific atherosclerotic disease is seen. The great vessels are within normal limits. Mediastinum/Nodes: The mediastinum is unremarkable in appearance. No mediastinal lymphadenopathy is seen. No pericardial effusion is identified. The visualized portions of the thyroid gland are unremarkable. No axillary lymphadenopathy is seen. Lungs/Pleura: The lungs are clear bilaterally. No focal consolidation, pleural effusion or pneumothorax seen. No masses are identified. Upper Abdomen: The visualized portions of  the liver and spleen are unremarkable. The visualized portions of the gallbladder, pancreas, adrenal glands and kidneys are within normal limits. 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 Musculoskeletal: No acute osseous abnormalities are identified. The visualized musculature is unremarkable in appearance. Review of the MIP images confirms the above findings. IMPRESSION: 1. No evidence of pulmonary embolus. 2. Lungs clear bilaterally. 3. Focal relatively severe luminal narrowing at the proximal celiac trunk. 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 Would correlate for any associated symptoms (abdominal pain or weight loss). No significant collateral vessels are yet seen; this is often asymptomatic until around the age of 59. If the patient is symptomatic, surgical consultation would be helpful. Electronically Signed   By: Roanna Raider M.D.   On: 01/04/2017 21:35    Procedures Procedures (including critical care time)  Medications Ordered in ED Medications  LORazepam (ATIVAN) injection 1 mg (1 mg Intravenous Given 01/04/17 1831)  potassium chloride SA (K-DUR,KLOR-CON) CR tablet 40 mEq (40 mEq Oral Given 01/04/17 1947)  iopamidol (ISOVUE-370) 76 % injection (100 mLs  Contrast Given 01/04/17 2104)     Initial Impression / Assessment and Plan / ED Course  I have reviewed the triage vital signs and the nursing notes.  Pertinent labs & imaging results that were available during my care of the patient were reviewed by me and considered in my medical decision making (see chart for details).  Clinical Course     Pt comes in with cc of chest pain, cough. Pt's chest pain is pleuritic. He is on amoxi for sinusitis already. Pt is noted to be tachycardic and he has non specific intraventricular conduction findings. Pt's CT PE is neg.  Cards consulted -they noted  a pericardial frictional rub. We will admit for pericarditis.   Final Clinical Impressions(s) / ED Diagnoses   Final diagnoses:  Acute infective pericarditis, unspecified infectious etiology    New Prescriptions Discharge Medication List as of 01/05/2017  3:27 PM    START taking these medications   Details  colchicine 0.6 MG tablet Take 1 tablet (0.6 mg total) by mouth daily., Starting Fri 01/06/2017, Normal    ibuprofen (ADVIL,MOTRIN) 600 MG tablet Take 1 tablet (600 mg total) by mouth every 8 (eight) hours., Starting Thu 01/05/2017, Normal       I  personally performed the services described in this documentation, which was scribed in my presence. The recorded information has been reviewed and is accurate.     Derwood KaplanAnkit Venecia Mehl, MD 01/06/17 [phone]

## 2017-01-04 NOTE — ED Notes (Signed)
Pt returned from CT and connected to the monitor 

## 2017-01-04 NOTE — ED Notes (Signed)
Sent label down to add ESR

## 2017-01-04 NOTE — ED Triage Notes (Signed)
Pt reports having recent sinus infection. Having productive cough. Now has chest pain that occurs when coughing and breathing. No acute distress noted at triage.

## 2017-01-04 NOTE — ED Notes (Signed)
Patient transported to CT 

## 2017-01-04 NOTE — H&P (Signed)
History and Physical    Douglas Bailey WUJ:[id number] DOB: 30-May-1991 DOA: 01/04/2017  PCP: No PCP Per Patient Consultants:  Psychiatry - Idelia Salm Patient coming from: home - lives with girlfriend; NOK: [place], [phone]  Chief Complaint: chest pain  HPI: Douglas Bailey is a 26 y.o. male with medical history significant of psychiatric disease presenting with CP.  He was seen in the ER on 1/4 and given Amoxicillin for sinusitis.  Since starting the antibiotic, he has been experiencing chest pain.  Substernal today, yesterday on the far right side.  No SOB unless lying in a certain way, better with sitting forward.  Continuing to have brown-colored nasal drainage.     ED Course: Per Dr. Kathrynn Humble: Pt comes in with cc of chest pain, cough.  Pt's chest pain is pleuritic. He is on amoxi for sinusitis already.  Pt is noted to be tachy   Review of Systems: As per HPI; otherwise 10 point review of systems reviewed and negative.   Ambulatory Status:  ambulates without assistance  Past Medical History:  Diagnosis Date  . ADHD   . Anxiety   . Bipolar disorder ([place])   . Depression   . Dysuria   . Hematuria   . History of kidney stones 2015  . Nausea   . Retained ureteral stent 12/21    Past Surgical History:  Procedure Laterality Date  . CYSTOSCOPY W/ URETERAL STENT PLACEMENT Left 12/15/14  . CYSTOSCOPY W/ URETERAL STENT REMOVAL Left 12/22/2014   Procedure: CYSTOSCOPY WITH LEFT STENT REMOVAL;  Surgeon: Alexis Frock, MD;  Location: [facility];  Service: Urology;  Laterality: Left;  . CYSTOSCOPY WITH RETROGRADE PYELOGRAM, URETEROSCOPY AND STENT PLACEMENT Left 12/15/2014   Procedure: CYSTOSCOPY STENT PLACEMENT left ureter;  Surgeon: Malka So, MD;  Location: [facility];  Service: Urology;  Laterality: Left;  . MOUTH SURGERY Bilateral 2013    Social History   Social History  . Marital status: Single    Spouse name: N/A  . Number of children: N/A  . Years of  education: N/A   Occupational History  . disabled    Social History Main Topics  . Smoking status: Never Smoker  . Smokeless tobacco: Never Used  . Alcohol use No  . Drug use: No  . Sexual activity: Not on file   Other Topics Concern  . Not on file   Social History Narrative   Dropped out of school in 10th grade, does not have GED.    No Known Allergies  History reviewed. No pertinent family history.  Prior to Admission medications   Medication Sig Start Date End Date Taking? Authorizing Provider  amoxicillin (AMOXIL) 500 MG capsule Take 1 capsule (500 mg total) by mouth 3 (three) times daily. 12/30/16   Tanna Furry, MD  busPIRone (BUSPAR) 15 MG tablet Take 15 mg by mouth 2 (two) times daily. 11/22/14   Historical Provider, MD  ciprofloxacin (CIPRO) 500 MG tablet Take 1 tablet (500 mg total) by mouth 2 (two) times daily. 12/15/14   Irine Seal, MD  citalopram (CELEXA) 20 MG tablet Take 20 mg by mouth every morning.  11/27/14   Historical Provider, MD  FOCALIN XR 5 MG 24 hr capsule Take 5 mg by mouth daily. 10/25/14   Historical Provider, MD  lamoTRIgine (LAMICTAL) 100 MG tablet Take 100 mg by mouth at bedtime. 09/07/14   Historical Provider, MD  ondansetron (ZOFRAN ODT) 4 MG disintegrating tablet Take 1 tablet (4 mg total) by mouth  every 8 (eight) hours as needed for nausea or vomiting. 12/14/14   Fransico Meadow, PA-C  oxyCODONE-acetaminophen (PERCOCET/ROXICET) 5-325 MG per tablet Take 1 tablet by mouth every 4 (four) hours as needed for severe pain. 12/22/14   Alexis Frock, MD  phenazopyridine (PYRIDIUM) 200 MG tablet Take 1 tablet (200 mg total) by mouth 3 (three) times daily as needed for pain. 12/15/14   Irine Seal, MD  senna-docusate (SENOKOT-S) 8.6-50 MG per tablet Take 1 tablet by mouth 2 (two) times daily. While taking pain meds to prevent constipation 12/22/14   Alexis Frock, MD    Physical Exam: Vitals:   01/04/17 2130 01/04/17 2215 01/04/17 2300 01/05/17 0025  BP:  114/75 116/63 115/69 121/78  Pulse: (!) 136 (!) 137 (!) 132 (!) 125  Resp: 23 (!) '29 19 19  ' Temp:    98.1 F (36.7 C)  TempSrc:    Oral  SpO2: 99% 98% 97% 99%  Weight:    56.2 kg (123 lb 14.4 oz)  Height:    '5\' 8"'  (1.727 m)     General:  Appears calm and comfortable and is NAD; he appears anxious and has multiple lip piercings and significant facial hair Eyes:  PERRL, EOMI, normal lids, iris ENT:  grossly normal hearing, lips & tongue, mmm Neck:  no LAD, masses or thyromegaly Cardiovascular:  Tachycardia with obvious pericardial friction rub. No LE edema.  Respiratory:  CTA bilaterally, no w/r/r. Normal respiratory effort. Abdomen:  soft, ntnd, NABS Skin:  no rash or induration seen on limited exam Musculoskeletal:  grossly normal tone BUE/BLE, good ROM, no bony abnormality Psychiatric:  anxious, speech hesitant but appropriate, AOx3, ?cognitive deficit Neurologic:  CN 2-12 grossly intact, moves all extremities in coordinated fashion, sensation intact  Labs on Admission: I have personally reviewed following labs and imaging studies  CBC:  Recent Labs Lab 01/04/17 1839 01/04/17 1903  WBC 12.1*  --   NEUTROABS 10.4*  --   HGB 15.5 15.3  HCT 44.0 45.0  MCV 85.3  --   PLT 508*  --    Basic Metabolic Panel:  Recent Labs Lab 01/04/17 1839 01/04/17 1903  NA  --  142  K  --  3.2*  CL  --  106  GLUCOSE  --  120*  BUN  --  10  CREATININE  --  0.60*  MG 1.8  --    GFR: Estimated Creatinine Clearance: 114.2 mL/min (by C-G formula based on SCr of 0.6 mg/dL (L)). Liver Function Tests: No results for input(s): AST, ALT, ALKPHOS, BILITOT, PROT, ALBUMIN in the last 168 hours. No results for input(s): LIPASE, AMYLASE in the last 168 hours. No results for input(s): AMMONIA in the last 168 hours. Coagulation Profile: No results for input(s): INR, PROTIME in the last 168 hours. Cardiac Enzymes:  Recent Labs Lab 01/04/17 1839  TROPONINI <0.03   BNP (last 3 results) No  results for input(s): PROBNP in the last 8760 hours. HbA1C: No results for input(s): HGBA1C in the last 72 hours. CBG: No results for input(s): GLUCAP in the last 168 hours. Lipid Profile: No results for input(s): CHOL, HDL, LDLCALC, TRIG, CHOLHDL, LDLDIRECT in the last 72 hours. Thyroid Function Tests:  Recent Labs  01/04/17 2049  TSH 0.533   Anemia Panel: No results for input(s): VITAMINB12, FOLATE, FERRITIN, TIBC, IRON, RETICCTPCT in the last 72 hours. Urine analysis:    Component Value Date/Time   COLORURINE BROWN (A) 12/20/2014 0850   APPEARANCEUR TURBID (A) 12/20/2014  0850   LABSPEC 1.025 12/20/2014 0850   PHURINE 5.5 12/20/2014 0850   GLUCOSEU NEGATIVE 12/20/2014 0850   HGBUR LARGE (A) 12/20/2014 0850   BILIRUBINUR MODERATE (A) 12/20/2014 0850   KETONESUR 15 (A) 12/20/2014 0850   PROTEINUR >300 (A) 12/20/2014 0850   UROBILINOGEN 1.0 12/20/2014 0850   NITRITE POSITIVE (A) 12/20/2014 0850   LEUKOCYTESUR MODERATE (A) 12/20/2014 0850    Creatinine Clearance: Estimated Creatinine Clearance: 114.2 mL/min (by C-G formula based on SCr of 0.6 mg/dL (L)).  Sepsis Labs: '@LABRCNTIP' (procalcitonin:4,lacticidven:4) )No results found for this or any previous visit (from the past 240 hour(s)).   Radiological Exams on Admission: Dg Chest 2 View  Result Date: 01/04/2017 CLINICAL DATA:  cough/cp EXAM: CHEST  2 VIEW COMPARISON:  06/17/2008. FINDINGS: Pectus deformity noted. Mediastinum and hilar structures normal. Lungs are clear. Heart size normal. No pleural effusion or pneumothorax. No acute bony abnormality. IMPRESSION: No acute cardiopulmonary disease. Electronically Signed   By: Marcello Moores  Register   On: 01/04/2017 15:21   Ct Angio Chest Pe W And/or Wo Contrast  Result Date: 01/04/2017 CLINICAL DATA:  Acute onset of generalized chest pain. Initial encounter. EXAM: CT ANGIOGRAPHY CHEST WITH CONTRAST TECHNIQUE: Multidetector CT imaging of the chest was performed using the standard  protocol during bolus administration of intravenous contrast. Multiplanar CT image reconstructions and MIPs were obtained to evaluate the vascular anatomy. CONTRAST:  80 mL of Isovue 370 IV contrast COMPARISON:  Chest radiograph performed earlier today at 2:57 p.m. FINDINGS: Cardiovascular:  There is no evidence of pulmonary embolus. The heart is normal in size. The thoracic aorta is unremarkable. No calcific atherosclerotic disease is seen. The great vessels are within normal limits. Mediastinum/Nodes: The mediastinum is unremarkable in appearance. No mediastinal lymphadenopathy is seen. No pericardial effusion is identified. The visualized portions of the thyroid gland are unremarkable. No axillary lymphadenopathy is seen. Lungs/Pleura: The lungs are clear bilaterally. No focal consolidation, pleural effusion or pneumothorax seen. No masses are identified. Upper Abdomen: The visualized portions of the liver and spleen are unremarkable. The visualized portions of the gallbladder, pancreas, adrenal glands and kidneys are within normal limits. 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 Musculoskeletal: No acute osseous abnormalities are identified. The visualized musculature is unremarkable in appearance. Review of the MIP images confirms the above findings. IMPRESSION: 1. No evidence of pulmonary embolus. 2. Lungs clear bilaterally. 3. Focal relatively severe luminal narrowing at the proximal celiac trunk. 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 Would correlate for any associated symptoms (abdominal pain or weight loss). No significant collateral vessels are yet seen; this is often asymptomatic until around the age of 62. If the patient is symptomatic, surgical consultation would be helpful. Electronically Signed   By: Garald Balding M.D.   On: 01/04/2017 21:35    EKG:  Independently reviewed.  Tachycardia with rate 128; nonspecific ST changes with no evidence of acute ischemia  Assessment/Plan Principal Problem:   Acute pericarditis Active Problems:   Hypokalemia   Hyperglycemia   Bipolar disorder (HCC)   Acute pericarditis -Patient presenting with several days of chest pain -In the ER he was found to have a loud pericardial friction rub c/w pericarditis -Per report by Dr. Kathrynn Humble, cardiology was consulted and recommended admission by [facility] -Most likely etiology appears to be viral since patient was diagnosed with sinusitis around the time of the onset of symptoms -UDS  negative -Will check HIV -ESR slightly abnormal, will check CRP -EKG does not appear to show diffuse ST elevation or PR depression; will repeat EKG in AM -His only major risk factor that necessitates inpatient evaluation is the subacute nature of the onset -Will observe overnight on telemetry -No evidence of tamponade, will order Echo routine rather than stat  -Will treat with Colchicine (he is <70kg, so once daily is appropriate) and Motrin 600 mg q8h x 1-2 weeks and then tapering -Persistent tachycardia is likely to improve with treatment -Patient will need activity restriction  Incidental finding: Focal relatively severe luminal narrowing at the proximal celiac trunk. -Patient is likely to manifest symptoms by age 68, with weight loss and/or abdominal pain -At that time, he will need surgical consultation  Hypokalemia -K 3.2 -Replete with 40 mEq KCl x 1 PO  Hypergylcemia -Glucose 120 -May be stress response, will follow  Bipolar -Reports taking Klonopin, no other apparent medications for this condition  DVT prophylaxis:  Lovenox  Code Status:  Full - confirmed with patient Family Communication: None present Disposition Plan: Home once clinically improved Consults called: Cardiology  Admission status: It is my clinical opinion that referral for OBSERVATION is reasonable  and necessary in this patient based on the above information provided. The aforementioned taken together are felt to place the patient at high risk for further clinical deterioration. However it is anticipated that the patient may be medically stable for discharge from the hospital within 24 to 48 hours.    Karmen Bongo MD [facility]  If 7PM-7AM, please contact night-coverage [email or web address] Password [email or web address]  01/05/2017, 1:05 AM

## 2017-01-05 ENCOUNTER — Observation Stay (HOSPITAL_COMMUNITY): Payer: Medicaid Other

## 2017-01-05 DIAGNOSIS — I771 Stricture of artery: Secondary | ICD-10-CM

## 2017-01-05 DIAGNOSIS — R739 Hyperglycemia, unspecified: Secondary | ICD-10-CM | POA: Diagnosis present

## 2017-01-05 DIAGNOSIS — I309 Acute pericarditis, unspecified: Principal | ICD-10-CM

## 2017-01-05 DIAGNOSIS — E876 Hypokalemia: Secondary | ICD-10-CM | POA: Diagnosis not present

## 2017-01-05 DIAGNOSIS — F319 Bipolar disorder, unspecified: Secondary | ICD-10-CM | POA: Diagnosis not present

## 2017-01-05 DIAGNOSIS — I774 Celiac artery compression syndrome: Secondary | ICD-10-CM

## 2017-01-05 LAB — BASIC METABOLIC PANEL
ANION GAP: 7 (ref 5–15)
BUN: 9 mg/dL (ref 6–20)
CHLORIDE: 108 mmol/L (ref 101–111)
CO2: 27 mmol/L (ref 22–32)
Calcium: 9.6 mg/dL (ref 8.9–10.3)
Creatinine, Ser: 0.49 mg/dL — ABNORMAL LOW (ref 0.61–1.24)
Glucose, Bld: 124 mg/dL — ABNORMAL HIGH (ref 65–99)
POTASSIUM: 3.8 mmol/L (ref 3.5–5.1)
SODIUM: 142 mmol/L (ref 135–145)

## 2017-01-05 LAB — HIV ANTIBODY (ROUTINE TESTING W REFLEX): HIV Screen 4th Generation wRfx: NONREACTIVE

## 2017-01-05 LAB — CBC
HCT: 41.9 % (ref 39.0–52.0)
Hemoglobin: 14 g/dL (ref 13.0–17.0)
MCH: 28.9 pg (ref 26.0–34.0)
MCHC: 33.4 g/dL (ref 30.0–36.0)
MCV: 86.6 fL (ref 78.0–100.0)
PLATELETS: 430 10*3/uL — AB (ref 150–400)
RBC: 4.84 MIL/uL (ref 4.22–5.81)
RDW: 12.2 % (ref 11.5–15.5)
WBC: 7.7 10*3/uL (ref 4.0–10.5)

## 2017-01-05 LAB — TROPONIN I

## 2017-01-05 LAB — ECHOCARDIOGRAM COMPLETE
HEIGHTINCHES: 68 in
Weight: 1982.38 oz

## 2017-01-05 LAB — SEDIMENTATION RATE: Sed Rate: 20 mm/hr — ABNORMAL HIGH (ref 0–16)

## 2017-01-05 LAB — C-REACTIVE PROTEIN

## 2017-01-05 LAB — TSH: TSH: 1.144 u[IU]/mL (ref 0.350–4.500)

## 2017-01-05 MED ORDER — DEXMETHYLPHENIDATE HCL ER 5 MG PO CP24: 10.0000 mg | ORAL_CAPSULE | ORAL | Status: DC

## 2017-01-05 MED ORDER — IBUPROFEN 600 MG PO TABS: 600.0000 mg | ORAL_TABLET | Freq: Three times a day (TID) | ORAL | 0 refills | Status: DC

## 2017-01-05 MED ORDER — ARIPIPRAZOLE 5 MG PO TABS
5.0000 mg | ORAL_TABLET | Freq: Every day | ORAL | Status: DC
  Filled 2017-01-05: qty 1

## 2017-01-05 MED ORDER — ACETAMINOPHEN 325 MG PO TABS: 650.0000 mg | ORAL_TABLET | Freq: Four times a day (QID) | ORAL | Status: DC | PRN

## 2017-01-05 MED ORDER — AMOXICILLIN 500 MG PO CAPS
500.0000 mg | ORAL_CAPSULE | Freq: Three times a day (TID) | ORAL | Status: DC
  Administered 2017-01-05: 500 mg via ORAL
  Filled 2017-01-05: qty 1

## 2017-01-05 MED ORDER — GUAIFENESIN-DM 100-10 MG/5ML PO SYRP: 5.0000 mL | ORAL_SOLUTION | ORAL | Status: DC | PRN

## 2017-01-05 MED ORDER — COLCHICINE 0.6 MG PO TABS
0.6000 mg | ORAL_TABLET | Freq: Every day | ORAL | Status: DC
  Administered 2017-01-05: 0.6 mg via ORAL
  Filled 2017-01-05 (×2): qty 1

## 2017-01-05 MED ORDER — MORPHINE SULFATE (PF) 2 MG/ML IV SOLN: 2.0000 mg | INTRAVENOUS | Status: DC | PRN

## 2017-01-05 MED ORDER — ONDANSETRON HCL 4 MG/2ML IJ SOLN: 4.0000 mg | Freq: Four times a day (QID) | INTRAMUSCULAR | Status: DC | PRN

## 2017-01-05 MED ORDER — PANTOPRAZOLE SODIUM 40 MG PO TBEC
40.0000 mg | DELAYED_RELEASE_TABLET | Freq: Every day | ORAL | Status: DC
  Administered 2017-01-05: 40 mg via ORAL
  Filled 2017-01-05: qty 1

## 2017-01-05 MED ORDER — COLCHICINE 0.6 MG PO TABS: 0.6000 mg | ORAL_TABLET | Freq: Every day | ORAL | Status: DC

## 2017-01-05 MED ORDER — ENOXAPARIN SODIUM 40 MG/0.4ML ~~LOC~~ SOLN
40.0000 mg | SUBCUTANEOUS | Status: DC
  Administered 2017-01-05: 40 mg via SUBCUTANEOUS
  Filled 2017-01-05: qty 0.4

## 2017-01-05 MED ORDER — LAMOTRIGINE 25 MG PO TABS: 100.0000 mg | ORAL_TABLET | Freq: Every day | ORAL | Status: DC

## 2017-01-05 MED ORDER — ACETAMINOPHEN 650 MG RE SUPP: 650.0000 mg | Freq: Four times a day (QID) | RECTAL | Status: DC | PRN

## 2017-01-05 MED ORDER — CLONAZEPAM 1 MG PO TABS
1.0000 mg | ORAL_TABLET | Freq: Three times a day (TID) | ORAL | Status: DC | PRN
  Administered 2017-01-05: 1 mg via ORAL
  Filled 2017-01-05: qty 1

## 2017-01-05 MED ORDER — ONDANSETRON HCL 4 MG PO TABS: 4.0000 mg | ORAL_TABLET | Freq: Four times a day (QID) | ORAL | Status: DC | PRN

## 2017-01-05 MED ORDER — CLONAZEPAM 0.5 MG PO TABS: 0.5000 mg | ORAL_TABLET | Freq: Two times a day (BID) | ORAL | Status: DC | PRN

## 2017-01-05 MED ORDER — CITALOPRAM HYDROBROMIDE 20 MG PO TABS
30.0000 mg | ORAL_TABLET | Freq: Every day | ORAL | Status: DC
  Administered 2017-01-05: 30 mg via ORAL
  Filled 2017-01-05: qty 2

## 2017-01-05 MED ORDER — SODIUM CHLORIDE 0.9% FLUSH
3.0000 mL | Freq: Two times a day (BID) | INTRAVENOUS | Status: DC
  Administered 2017-01-05: 3 mL via INTRAVENOUS

## 2017-01-05 MED ORDER — POTASSIUM CHLORIDE CRYS ER 20 MEQ PO TBCR: 40.0000 meq | EXTENDED_RELEASE_TABLET | Freq: Two times a day (BID) | ORAL | Status: DC

## 2017-01-05 MED ORDER — IBUPROFEN 600 MG PO TABS
600.0000 mg | ORAL_TABLET | Freq: Three times a day (TID) | ORAL | Status: DC
  Administered 2017-01-05 (×2): 600 mg via ORAL
  Filled 2017-01-05 (×3): qty 1

## 2017-01-05 MED ORDER — COLCHICINE 0.6 MG PO TABS: 0.6000 mg | ORAL_TABLET | Freq: Every day | ORAL | 0 refills | Status: DC

## 2017-01-05 MED ORDER — GUAIFENESIN-DM 100-10 MG/5ML PO SYRP
5.0000 mL | ORAL_SOLUTION | ORAL | Status: DC | PRN
  Administered 2017-01-05: 5 mL via ORAL
  Filled 2017-01-05: qty 5

## 2017-01-05 MED ORDER — LORAZEPAM 0.5 MG PO TABS: 0.5000 mg | ORAL_TABLET | Freq: Once | ORAL | Status: DC

## 2017-01-05 NOTE — Progress Notes (Signed)
PROGRESS NOTE    Douglas Bailey  WUJ:[id number] DOB: Jun 05, 1991 DOA: 01/04/2017  PCP: No PCP Per Patient   Brief Narrative:   Douglas Bailey is a 26 y.o. male with medical history significant of psychiatric disease presenting with Chest pain.  He was seen in the ER on 1/4 and given Amoxicillin for sinusitis.  Since starting the antibiotic, he has been experiencing chest pain mainly occurring when he coughs or takes a deep breath and not present at rest. No focal chest wall tenderness noted.   Subjective: No change in pain but cough is improving. No new symptoms.   Assessment & Plan:   Principal Problem: Chest pain - starting after sinusitis/ URI - CT chest showing no pulm etiology - ? Acute pericarditis- cardiology has started NSAIDs and colchicine- follow- ECHO unrevealing  Active Problems:   Hypokalemia - replacing    Hyperglycemia - mild- follow    Bipolar disorder - resume home medications  Note: narrowing of celiac trunk noted on CT - outpt follow up  DVT prophylaxis:  Lovenox Code Status: full code Family Communication:  Disposition Plan: home when stable Consultants:   cardiology Procedures:    Antimicrobials:  Anti-infectives    Start     Dose/Rate Route Frequency Ordered Stop   01/05/17 1000  amoxicillin (AMOXIL) capsule 500 mg     500 mg Oral 3 times daily 01/05/17 0104         Objective: Vitals:   01/05/17 0025 01/05/17 0554 01/05/17 1050 01/05/17 1316  BP: 121/78 (!) 88/51 112/71 106/67  Pulse: (!) 125 79 99 (!) 117  Resp: 19 19 19 18   Temp: 98.1 F (36.7 C) 98 F (36.7 C)  98.4 F (36.9 C)  TempSrc: Oral Oral  Oral  SpO2: 99% 100% 100% 100%  Weight: 56.2 kg (123 lb 14.4 oz)     Height: 5\' 8"  (1.727 m)       Intake/Output Summary (Last 24 hours) at 01/05/17 1419 Last data filed at 01/05/17 1230  Gross per 24 hour  Intake              140 ml  Output                0 ml  Net              140 ml   Filed Weights   01/05/17 0025  Weight:  56.2 kg (123 lb 14.4 oz)    Examination: General exam: Appears comfortable  HEENT: PERRLA, oral mucosa moist, no sclera icterus or thrush Respiratory system: Clear to auscultation. Respiratory effort normal. Cardiovascular system: S1 & S2 heard, RRR.  No murmurs  Chest: no chest wall tenderness Gastrointestinal system: Abdomen soft, non-tender, nondistended. Normal bowel sound. No organomegaly Central nervous system: Alert and oriented. No focal neurological deficits. Extremities: No cyanosis, clubbing or edema Skin: No rashes or ulcers Psychiatry:  Mood & affect appropriate.     Data Reviewed: I have personally reviewed following labs and imaging studies  CBC:  Recent Labs Lab 01/04/17 1839 01/04/17 1903 01/05/17 0251  WBC 12.1*  --  7.7  NEUTROABS 10.4*  --   --   HGB 15.5 15.3 14.0  HCT 44.0 45.0 41.9  MCV 85.3  --  86.6  PLT 508*  --  430*   Basic Metabolic Panel:  Recent Labs Lab 01/04/17 1839 01/04/17 1903 01/05/17 0251  NA  --  142 142  K  --  3.2* 3.8  CL  --  106 108  CO2  --   --  27  GLUCOSE  --  120* 124*  BUN  --  10 9  CREATININE  --  0.60* 0.49*  CALCIUM  --   --  9.6  MG 1.8  --   --    GFR: Estimated Creatinine Clearance: 114.2 mL/min (by C-G formula based on SCr of 0.49 mg/dL (L)). Liver Function Tests: No results for input(s): AST, ALT, ALKPHOS, BILITOT, PROT, ALBUMIN in the last 168 hours. No results for input(s): LIPASE, AMYLASE in the last 168 hours. No results for input(s): AMMONIA in the last 168 hours. Coagulation Profile: No results for input(s): INR, PROTIME in the last 168 hours. Cardiac Enzymes:  Recent Labs Lab 01/04/17 1839 01/05/17 0251 01/05/17 0744  TROPONINI <0.03 <0.03 <0.03   BNP (last 3 results) No results for input(s): PROBNP in the last 8760 hours. HbA1C: No results for input(s): HGBA1C in the last 72 hours. CBG: No results for input(s): GLUCAP in the last 168 hours. Lipid Profile: No results for  input(s): CHOL, HDL, LDLCALC, TRIG, CHOLHDL, LDLDIRECT in the last 72 hours. Thyroid Function Tests:  Recent Labs  01/05/17 0251  TSH 1.144   Anemia Panel: No results for input(s): VITAMINB12, FOLATE, FERRITIN, TIBC, IRON, RETICCTPCT in the last 72 hours. Urine analysis:    Component Value Date/Time   COLORURINE BROWN (A) 12/20/2014 0850   APPEARANCEUR TURBID (A) 12/20/2014 0850   LABSPEC 1.025 12/20/2014 0850   PHURINE 5.5 12/20/2014 0850   GLUCOSEU NEGATIVE 12/20/2014 0850   HGBUR LARGE (A) 12/20/2014 0850   BILIRUBINUR MODERATE (A) 12/20/2014 0850   KETONESUR 15 (A) 12/20/2014 0850   PROTEINUR >300 (A) 12/20/2014 0850   UROBILINOGEN 1.0 12/20/2014 0850   NITRITE POSITIVE (A) 12/20/2014 0850   LEUKOCYTESUR MODERATE (A) 12/20/2014 0850   Sepsis Labs: @LABRCNTIP (procalcitonin:4,lacticidven:4) )No results found for this or any previous visit (from the past 240 hour(s)).       Radiology Studies: Dg Chest 2 View  Result Date: 01/04/2017 CLINICAL DATA:  cough/cp EXAM: CHEST  2 VIEW COMPARISON:  06/17/2008. FINDINGS: Pectus deformity noted. Mediastinum and hilar structures normal. Lungs are clear. Heart size normal. No pleural effusion or pneumothorax. No acute bony abnormality. IMPRESSION: No acute cardiopulmonary disease. Electronically Signed   By: Maisie Fushomas  Register   On: 01/04/2017 15:21   Ct Angio Chest Pe W And/or Wo Contrast  Result Date: 01/04/2017 CLINICAL DATA:  Acute onset of generalized chest pain. Initial encounter. EXAM: CT ANGIOGRAPHY CHEST WITH CONTRAST TECHNIQUE: Multidetector CT imaging of the chest was performed using the standard protocol during bolus administration of intravenous contrast. Multiplanar CT image reconstructions and MIPs were obtained to evaluate the vascular anatomy. CONTRAST:  80 mL of Isovue 370 IV contrast COMPARISON:  Chest radiograph performed earlier today at 2:57 p.m. FINDINGS: Cardiovascular:  There is no evidence of pulmonary embolus.  The heart is normal in size. The thoracic aorta is unremarkable. No calcific atherosclerotic disease is seen. The great vessels are within normal limits. Mediastinum/Nodes: The mediastinum is unremarkable in appearance. No mediastinal lymphadenopathy is seen. No pericardial effusion is identified. The visualized portions of the thyroid gland are unremarkable. No axillary lymphadenopathy is seen. Lungs/Pleura: The lungs are clear bilaterally. No focal consolidation, pleural effusion or pneumothorax seen. No masses are identified. Upper Abdomen: The visualized portions of the liver and spleen are unremarkable. The visualized portions of the gallbladder, pancreas, adrenal glands and kidneys are within normal limits. There is focal relatively severe luminal  narrowing at the proximal celiac trunk; this could reflect compression due to the median arcuate ligament. Musculoskeletal: No acute osseous abnormalities are identified. The visualized musculature is unremarkable in appearance. Review of the MIP images confirms the above findings. IMPRESSION: 1. No evidence of pulmonary embolus. 2. Lungs clear bilaterally. 3. Focal relatively severe luminal narrowing at the proximal celiac trunk. 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 Would correlate for any associated symptoms (abdominal pain or weight loss). No significant collateral vessels are yet seen; this is often asymptomatic until around the age of 52. If the patient is symptomatic, surgical consultation would be helpful. Electronically Signed   By: Roanna Raider M.D.   On: 01/04/2017 21:35      Scheduled Meds: . amoxicillin  500 mg Oral TID  . colchicine  0.6 mg Oral Daily  . enoxaparin (LOVENOX) injection  40 mg Subcutaneous Q24H  . ibuprofen  600 mg Oral Q8H  . LORazepam  0.5 mg Oral Once  . pantoprazole  40 mg Oral Daily  . sodium chloride flush  3 mL Intravenous Q12H   Continuous Infusions:   LOS: 0 days     Time spent in minutes: 35    Asa Baudoin, MD [facility] Pager: [email or web address] Password [email or web address] 01/05/2017, 2:19 PM

## 2017-01-05 NOTE — Progress Notes (Addendum)
Patient in a stable condition, discharge education completed with patient and girlfriend at bedside, they verbalised understanding, iv removed, tele dc ccmd notified, patient belongings at bedside, patient chose to walk off the unit with girlfriend

## 2017-01-05 NOTE — Progress Notes (Signed)
Patient Name: Douglas Bailey Date of Encounter: 01/05/2017  Primary Cardiologist: New, will be Dr [facility] Problem List     Principal Problem:   Acute pericarditis Active Problems:   Hypokalemia   Hyperglycemia   Bipolar disorder (HCC)    Subjective   Chest pain is improved, but still 6/10. No side effects from meds. Otherwise ok.  Inpatient Medications    Scheduled Meds: . amoxicillin  500 mg Oral TID  . colchicine  0.6 mg Oral Daily  . enoxaparin (LOVENOX) injection  40 mg Subcutaneous Q24H  . ibuprofen  600 mg Oral Q8H  . LORazepam  0.5 mg Oral Once  . pantoprazole  40 mg Oral Daily  . sodium chloride flush  3 mL Intravenous Q12H   Continuous Infusions:  PRN Meds: acetaminophen **OR** acetaminophen, clonazePAM, guaiFENesin-dextromethorphan, morphine injection, ondansetron **OR** ondansetron (ZOFRAN) IV   Vital Signs    Vitals:   01/04/17 2300 01/05/17 0025 01/05/17 0554 01/05/17 1050  BP: 115/69 121/78 (!) 88/51 112/71  Pulse: (!) 132 (!) 125 79 99  Resp: 19 19 19 19   Temp:  98.1 F (36.7 C) 98 F (36.7 C)   TempSrc:  Oral Oral   SpO2: 97% 99% 100% 100%  Weight:  123 lb 14.4 oz (56.2 kg)    Height:  5\' 8"  (1.727 m)     No intake or output data in the 24 hours ending 01/05/17 1232 Filed Weights   01/05/17 0025  Weight: 123 lb 14.4 oz (56.2 kg)    Physical Exam    GEN: Well nourished, well developed, in no acute distress.  HEENT: Grossly normal.  Neck: Supple, no JVD, carotid bruits, or masses. Cardiac: RRR, no murmurs, ++ rubs, no gallops. No clubbing, cyanosis, edema.  Radials/DP/PT 2+ and equal bilaterally.  Respiratory:  Respirations regular and unlabored, clear to auscultation bilaterally. GI: Soft, nontender, nondistended, BS + x 4. MS: no deformity or atrophy. Skin: warm and dry, no rash. Neuro:  Strength and sensation are intact. Psych: AAOx3.  Normal affect.  Labs    CBC  Recent Labs  01/04/17 1839 01/04/17 1903  01/05/17 0251  WBC 12.1*  --  7.7  NEUTROABS 10.4*  --   --   HGB 15.5 15.3 14.0  HCT 44.0 45.0 41.9  MCV 85.3  --  86.6  PLT 508*  --  430*   Basic Metabolic Panel  Recent Labs  01/04/17 1839 01/04/17 1903 01/05/17 0251  NA  --  142 142  K  --  3.2* 3.8  CL  --  106 108  CO2  --   --  27  GLUCOSE  --  120* 124*  BUN  --  10 9  CREATININE  --  0.60* 0.49*  CALCIUM  --   --  9.6  MG 1.8  --   --    Cardiac Enzymes  Recent Labs  01/04/17 1839 01/05/17 0251 01/05/17 0744  TROPONINI <0.03 <0.03 <0.03   D-Dimer  Recent Labs  01/04/17 1839  DDIMER <0.27   Thyroid Function Tests  Recent Labs  01/05/17 0251  TSH 1.144    Telemetry    ST - Personally Reviewed  ECG    N/A - Personally Reviewed  Radiology    Dg Chest 2 View Result Date: 01/04/2017 CLINICAL DATA:  cough/cp EXAM: CHEST  2 VIEW COMPARISON:  06/17/2008. FINDINGS: Pectus deformity noted. Mediastinum and hilar structures normal. Lungs are clear. Heart size normal. No pleural effusion or  pneumothorax. No acute bony abnormality. IMPRESSION: No acute cardiopulmonary disease. Electronically Signed   By: Maisie Fushomas  Register   On: 01/04/2017 15:21   Ct Angio Chest Pe W And/or Wo Contrast Result Date: 01/04/2017 CLINICAL DATA:  Acute onset of generalized chest pain. Initial encounter. EXAM: CT ANGIOGRAPHY CHEST WITH CONTRAST TECHNIQUE: Multidetector CT imaging of the chest was performed using the standard protocol during bolus administration of intravenous contrast. Multiplanar CT image reconstructions and MIPs were obtained to evaluate the vascular anatomy. CONTRAST:  80 mL of Isovue 370 IV contrast COMPARISON:  Chest radiograph performed earlier today at 2:57 p.m. FINDINGS: Cardiovascular:  There is no evidence of pulmonary embolus. The heart is normal in size. The thoracic aorta is unremarkable. No calcific atherosclerotic disease is seen. The great vessels are within normal limits. Mediastinum/Nodes: The  mediastinum is unremarkable in appearance. No mediastinal lymphadenopathy is seen. No pericardial effusion is identified. The visualized portions of the thyroid gland are unremarkable. No axillary lymphadenopathy is seen. Lungs/Pleura: The lungs are clear bilaterally. No focal consolidation, pleural effusion or pneumothorax seen. No masses are identified. Upper Abdomen: The visualized portions of the liver and spleen are unremarkable. The visualized portions of the gallbladder, pancreas, adrenal glands and kidneys are within normal limits. 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 Musculoskeletal: No acute osseous abnormalities are identified. The visualized musculature is unremarkable in appearance. Review of the MIP images confirms the above findings. IMPRESSION: 1. No evidence of pulmonary embolus. 2. Lungs clear bilaterally. 3. Focal relatively severe luminal narrowing at the proximal celiac trunk. 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 Would correlate for any associated symptoms (abdominal pain or weight loss). No significant collateral vessels are yet seen; this is often asymptomatic until around the age of 24. If the patient is symptomatic, surgical consultation would be helpful. Electronically Signed   By: Roanna RaiderJeffery  Chang M.D.   On: 01/04/2017 21:35   Cardiac Studies   ECHO performed, results pending  Patient Profile     26 year old male with a complex history of psychiatric disease including anxiety, depression, and bipolar disorder with variable compliance with his medications, admitted 01/10 with several days of chest pain and palpitations. Dx pericarditis, sinus tach  Assessment & Plan    Principal Problem: 1.  Acute pericarditis - sx improved on ibuprofen and colchicine - did not get am dose of ibuprofen, reasons unclear - if does not get better  today, may have to increase to 800 mg q8h - echo pending  Active Problems: 2.  Hypokalemia - improved, per IM  3.  Hyperglycemia - per IM  4.  Bipolar disorder (HCC) - per IM  Signed, Barrett, Rhonda, PA-C  01/05/2017, 12:32 PM   History and all data above reviewed.  Patient examined.  I agree with the findings as above.  Pain is improved.  HR is elevated but he is anxious.   The patient exam reveals COR:RRR, no rub  ,  Lungs: Clear  ,  Abd: Positive bowel sounds, no rebound no guarding, Ext No edema  .  All available labs, radiology testing, previous records reviewed. Agree with documented assessment and plan. Pericarditis with pain improved.  Continue current therapy.   Fayrene FearingJames Ada Woodbury  2:49 PM  01/05/2017

## 2017-01-05 NOTE — Discharge Summary (Addendum)
Physician Discharge Summary  Meredith LeedsSean Ramsaran WUJ:[id number] DOB: 10/08/1991 DOA: 01/04/2017  PCP: No PCP Per Patient  Admit date: 01/04/2017 Discharge date: 01/05/2017  Admitted From: home  Disposition:  home   Recommendations for Outpatient Follow-up:  1. Will f/u with Cardiology as outpt on 1/25  Discharge Condition:  stable   CODE STATUS:  Full code   Consultations:  cardiology    Discharge Diagnoses:  Principal Problem:   Acute pericarditis Active Problems:   Hypokalemia   Hyperglycemia   Bipolar disorder (HCC)   Stenosis of celiac artery (HCC)  Subjective: Continues to have pain in chest with coughing- cough is improving.   Brief Summary: Oliver HumSean Nolanis a 26 y.o.malewith medical history significant of psychiatric disease presenting with Chest pain. He was seen in the ER on 1/4 and given Amoxicillin for sinusitis. Since starting the antibiotic, he has been experiencing chest pain mainly occurring when he coughs or takes a deep breath and not present at rest. No focal chest wall tenderness noted.   Hospital Course:  Principal Problem: Chest pain - starting after sinusitis/ URI - CT chest showing no pulm etiology -  Suspected to be due acute pericarditis- audible pericardial friction rub-  cardiology has started NSAIDs x 3 wks and colchicine to be continued for 3 months   - 2 D ECHO unrevealing  Active Problems:   Hypokalemia - replacing    Hyperglycemia - mild- follow    Bipolar disorder - resume home medications  Note: narrowing of celiac trunk noted on CT - outpt follow up- discussed with patient   Discharge Instructions  Discharge Instructions    Diet - low sodium heart healthy    Complete by:  As directed    Increase activity slowly    Complete by:  As directed      Allergies as of 01/05/2017   No Known Allergies     Medication List    STOP taking these medications   amoxicillin 500 MG capsule Commonly known as:  AMOXIL     TAKE these  medications   ARIPiprazole 5 MG tablet Commonly known as:  ABILIFY Take 5 mg by mouth at bedtime.   citalopram 20 MG tablet Commonly known as:  CELEXA Take 30 mg by mouth daily.   clonazePAM 0.5 MG tablet Commonly known as:  KLONOPIN Take 0.5 mg by mouth 2 (two) times daily as needed for anxiety.   colchicine 0.6 MG tablet Take 1 tablet (0.6 mg total) by mouth daily. Start taking on:  01/06/2017   dexmethylphenidate 10 MG 24 hr capsule Commonly known as:  FOCALIN XR Take 10 mg by mouth every morning.   ibuprofen 600 MG tablet Commonly known as:  ADVIL,MOTRIN Take 1 tablet (600 mg total) by mouth every 8 (eight) hours.   lamoTRIgine 100 MG tablet Commonly known as:  LAMICTAL Take 100 mg by mouth at bedtime.      Follow-up Information    Barrett, Bjorn LoserRhonda, PA-C Follow up on 01/19/2017.   Specialties:  Cardiology, Radiology Why:  See provider at 10:00, please arrive at 9:45. Contact information: [address] [place] [address] [phone]          No Known Allergies   Procedures/Studies: 2 D ECHO Left ventricle: The cavity size was normal. Systolic function was   normal. The estimated ejection fraction was in the range of 55%   to 60%. Wall motion was normal; there were no regional wall   motion abnormalities. - Right ventricle: Not  able to assess RV size due to poor   visualization. - Right atrium: Poorly visualized.  Dg Chest 2 View  Result Date: 01/04/2017 CLINICAL DATA:  cough/cp EXAM: CHEST  2 VIEW COMPARISON:  06/17/2008. FINDINGS: Pectus deformity noted. Mediastinum and hilar structures normal. Lungs are clear. Heart size normal. No pleural effusion or pneumothorax. No acute bony abnormality. IMPRESSION: No acute cardiopulmonary disease. Electronically Signed   By: Maisie Fus  Register   On: 01/04/2017 15:21   Ct Angio Chest Pe W And/or Wo Contrast  Result Date: 01/04/2017 CLINICAL DATA:  Acute onset of generalized chest pain. Initial  encounter. EXAM: CT ANGIOGRAPHY CHEST WITH CONTRAST TECHNIQUE: Multidetector CT imaging of the chest was performed using the standard protocol during bolus administration of intravenous contrast. Multiplanar CT image reconstructions and MIPs were obtained to evaluate the vascular anatomy. CONTRAST:  80 mL of Isovue 370 IV contrast COMPARISON:  Chest radiograph performed earlier today at 2:57 p.m. FINDINGS: Cardiovascular:  There is no evidence of pulmonary embolus. The heart is normal in size. The thoracic aorta is unremarkable. No calcific atherosclerotic disease is seen. The great vessels are within normal limits. Mediastinum/Nodes: The mediastinum is unremarkable in appearance. No mediastinal lymphadenopathy is seen. No pericardial effusion is identified. The visualized portions of the thyroid gland are unremarkable. No axillary lymphadenopathy is seen. Lungs/Pleura: The lungs are clear bilaterally. No focal consolidation, pleural effusion or pneumothorax seen. No masses are identified. Upper Abdomen: The visualized portions of the liver and spleen are unremarkable. The visualized portions of the gallbladder, pancreas, adrenal glands and kidneys are within normal limits. 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 Musculoskeletal: No acute osseous abnormalities are identified. The visualized musculature is unremarkable in appearance. Review of the MIP images confirms the above findings. IMPRESSION: 1. No evidence of pulmonary embolus. 2. Lungs clear bilaterally. 3. Focal relatively severe luminal narrowing at the proximal celiac trunk. 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 Would correlate for any associated symptoms (abdominal pain or weight loss). No significant collateral vessels are yet seen; this is often asymptomatic until around the age of 57. If the patient is  symptomatic, surgical consultation would be helpful. Electronically Signed   By: Roanna Raider M.D.   On: 01/04/2017 21:35       Discharge Exam: Vitals:   01/05/17 1050 01/05/17 1316  BP: 112/71 106/67  Pulse: 99 (!) 117  Resp: 19 18  Temp:  98.4 F (36.9 C)   Vitals:   01/05/17 0025 01/05/17 0554 01/05/17 1050 01/05/17 1316  BP: 121/78 (!) 88/51 112/71 106/67  Pulse: (!) 125 79 99 (!) 117  Resp: 19 19 19 18   Temp: 98.1 F (36.7 C) 98 F (36.7 C)  98.4 F (36.9 C)  TempSrc: Oral Oral  Oral  SpO2: 99% 100% 100% 100%  Weight: 56.2 kg (123 lb 14.4 oz)     Height: 5\' 8"  (1.727 m)       General: Pt is alert, awake, not in acute distress Cardiovascular: RRR, S1/S2 +, no rubs, no gallops Respiratory: CTA bilaterally, no wheezing, no rhonchi Abdominal: Soft, NT, ND, bowel sounds + Extremities: no edema, no cyanosis    The results of significant diagnostics from this hospitalization (including imaging, microbiology, ancillary and laboratory) are listed below for reference.     Microbiology: No results found for this or any previous visit (from the past 240 hour(s)).  Labs: BNP (last 3 results) No results for input(s): BNP in the last 8760 hours. Basic Metabolic Panel:  Recent Labs Lab 01/04/17 1839 01/04/17 1903 01/05/17 0251  NA  --  142 142  K  --  3.2* 3.8  CL  --  106 108  CO2  --   --  27  GLUCOSE  --  120* 124*  BUN  --  10 9  CREATININE  --  0.60* 0.49*  CALCIUM  --   --  9.6  MG 1.8  --   --    Liver Function Tests: No results for input(s): AST, ALT, ALKPHOS, BILITOT, PROT, ALBUMIN in the last 168 hours. No results for input(s): LIPASE, AMYLASE in the last 168 hours. No results for input(s): AMMONIA in the last 168 hours. CBC:  Recent Labs Lab 01/04/17 1839 01/04/17 1903 01/05/17 0251  WBC 12.1*  --  7.7  NEUTROABS 10.4*  --   --   HGB 15.5 15.3 14.0  HCT 44.0 45.0 41.9  MCV 85.3  --  86.6  PLT 508*  --  430*   Cardiac  Enzymes:  Recent Labs Lab 01/04/17 1839 01/05/17 0251 01/05/17 0744  TROPONINI <0.03 <0.03 <0.03   BNP: Invalid input(s): POCBNP CBG: No results for input(s): GLUCAP in the last 168 hours. D-Dimer  Recent Labs  01/04/17 1839  DDIMER <0.27   Hgb A1c No results for input(s): HGBA1C in the last 72 hours. Lipid Profile No results for input(s): CHOL, HDL, LDLCALC, TRIG, CHOLHDL, LDLDIRECT in the last 72 hours. Thyroid function studies  Recent Labs  01/05/17 0251  TSH 1.144   Anemia work up No results for input(s): VITAMINB12, FOLATE, FERRITIN, TIBC, IRON, RETICCTPCT in the last 72 hours. Urinalysis    Component Value Date/Time   COLORURINE BROWN (A) 12/20/2014 0850   APPEARANCEUR TURBID (A) 12/20/2014 0850   LABSPEC 1.025 12/20/2014 0850   PHURINE 5.5 12/20/2014 0850   GLUCOSEU NEGATIVE 12/20/2014 0850   HGBUR LARGE (A) 12/20/2014 0850   BILIRUBINUR MODERATE (A) 12/20/2014 0850   KETONESUR 15 (A) 12/20/2014 0850   PROTEINUR >300 (A) 12/20/2014 0850   UROBILINOGEN 1.0 12/20/2014 0850   NITRITE POSITIVE (A) 12/20/2014 0850   LEUKOCYTESUR MODERATE (A) 12/20/2014 0850   Sepsis Labs Invalid input(s): PROCALCITONIN,  WBC,  LACTICIDVEN Microbiology No results found for this or any previous visit (from the past 240 hour(s)).   Time coordinating discharge: Over 30 minutes  SIGNED:   Calvert Cantor, MD  [facility] 01/05/2017, 3:27 PM Pager   If 7PM-7AM, please contact night-coverage [email or web address] Password [email or web address]

## 2017-01-05 NOTE — Consult Note (Signed)
CARDIOLOGY CONSULT NOTE   Referring Physician: Kathrynn Humble Primary Physician: No PCP Per Patient Primary Cardiologist: None Reason for Consultation: Chest pain, tachycardia  HPI: Douglas Bailey is a 26 year old male with a complex history of psychiatric disease including anxiety, depression, and bipolar disorder with variable compliance with his medications who presents with several days of chest pain and palpitations.  For the past week, he has had an upper respiratory infection which he has taken 4 days of Amoxicillin. For the past 4 days he has had chest pain which does not radiate, is not exertional, it is not pleuritic, it may be worsened by lying flat and improved by leaning forward. He presented to the ER today for evaluation of this pain.  He currently states that his chest pain is 4/10 but does not feel that he is in acute distress. He denies any fatigue, dyspnea on exertion, orthopnea, PND, lower extremity edema, palpitations, stroke or TIA symptoms. His heart rate on the monitor is between 114 and 149 bpm. His workup in the ED has included a negative D-Dimer and a negative CT PE.  He denies taking any stimulants including recreational drugs.  He denies any infectious symptoms including fevers, chills, night sweats, weight loss.  Review of Systems:     Cardiac Review of Systems: {Y] = yes _0  = no  Chest Pain _1   Resting SOB [   ] Exertional SOB  [  ]  Orthopnea [  ]   Pedal Edema [   ]    Palpitations _2  Syncope  [  ]   Presyncope [   ]  General Review of Systems: [Y] = yes [  ]=no Constitional: recent weight change [  ]; anorexia [  ]; fatigue [  ]; nausea [  ]; night sweats [  ]; fever [  ]; or chills [  ];                                                                     Eyes : blurred vision [  ]; diplopia [   ]; vision changes [  ];  Amaurosis fugax[  ]; Resp: cough [  ];  wheezing[  ];  hemoptysis[  ];  PND [  ];  GI:  gallstones[  ], vomiting[  ];  dysphagia[  ]; melena[   ];  hematochezia [  ]; heartburn[  ];   GU: kidney stones [  ]; hematuria[  ];   dysuria [  ];  nocturia[  ]; incontinence [  ];             Skin: rash, swelling[  ];, hair loss[  ];  peripheral edema[  ];  or itching[  ]; Musculosketetal: myalgias[  ];  joint swelling[  ];  joint erythema[  ];  joint pain[  ];  back pain[  ];  Heme/Lymph: bruising[  ];  bleeding[  ];  anemia[  ];  Neuro: TIA[  ];  headaches[  ];  stroke[  ];  vertigo[  ];  seizures[  ];   paresthesias[  ];  difficulty walking[  ];  Psych:depression_3 ; anxiety_4 ;  Endocrine: diabetes[  ];  thyroid dysfunction[  ];  Other:  Past Medical History:  Diagnosis Date  .  ADHD   . Anxiety   . Bipolar disorder ([place])   . Depression   . Dysuria   . Hematuria   . History of kidney stones 2015  . Nausea   . Retained ureteral stent 12/21    Medications Prior to Admission  Medication Sig Dispense Refill  . amoxicillin (AMOXIL) 500 MG capsule Take 1 capsule (500 mg total) by mouth 3 (three) times daily. 30 capsule 0  . [DISCONTINUED] ciprofloxacin (CIPRO) 500 MG tablet Take 1 tablet (500 mg total) by mouth 2 (two) times daily. (Patient not taking: Reported on 01/04/2017) 14 tablet 0  . [DISCONTINUED] ondansetron (ZOFRAN ODT) 4 MG disintegrating tablet Take 1 tablet (4 mg total) by mouth every 8 (eight) hours as needed for nausea or vomiting. (Patient not taking: Reported on 01/04/2017) 10 tablet 0  . [DISCONTINUED] oxyCODONE-acetaminophen (PERCOCET/ROXICET) 5-325 MG per tablet Take 1 tablet by mouth every 4 (four) hours as needed for severe pain. (Patient not taking: Reported on 01/04/2017) 30 tablet 0  . [DISCONTINUED] phenazopyridine (PYRIDIUM) 200 MG tablet Take 1 tablet (200 mg total) by mouth 3 (three) times daily as needed for pain. (Patient not taking: Reported on 01/04/2017) 10 tablet 0  . [DISCONTINUED] senna-docusate (SENOKOT-S) 8.6-50 MG per tablet Take 1 tablet by mouth 2 (two) times daily. While taking pain meds to prevent  constipation (Patient not taking: Reported on 01/04/2017) 30 tablet 0    . amoxicillin  500 mg Oral TID  . colchicine  0.6 mg Oral Daily  . enoxaparin (LOVENOX) injection  40 mg Subcutaneous Q24H  . ibuprofen  600 mg Oral Q8H  . LORazepam  0.5 mg Oral Once  . pantoprazole  40 mg Oral Daily  . sodium chloride flush  3 mL Intravenous Q12H   Infusions:  No Known Allergies  Social History   Social History  . Marital status: Single    Spouse name: N/A  . Number of children: N/A  . Years of education: N/A   Occupational History  . disabled    Social History Main Topics  . Smoking status: Never Smoker  . Smokeless tobacco: Never Used  . Alcohol use No  . Drug use: No  . Sexual activity: Not on file   Other Topics Concern  . Not on file   Social History Narrative   Dropped out of school in 10th grade, does not have GED.   History reviewed. No pertinent family history.  PHYSICAL EXAM: Vitals:   01/04/17 2300 01/05/17 0025  BP: 115/69 121/78  Pulse: (!) 132 (!) 125  Resp: 19 19  Temp:  98.1 F (36.7 C)    No intake or output data in the 24 hours ending 01/05/17 0140  General:  Well appearing. No respiratory difficulty. HEENT: Normal Neck: Supple. no JVD. Carotids 2+ bilat; no bruits. No lymphadenopathy or thryomegaly appreciated. Cor: PMI nondisplaced. Tachycardic, normal rhythm. He has a prominent 3-component friction rub over his LLSB. Lungs: clear Abdomen: soft, nontender, nondistended. No hepatosplenomegaly. No bruits or masses. Good bowel sounds. Extremities: no cyanosis, clubbing, rash, edema Neuro: alert & oriented x 3, cranial nerves grossly intact. moves all 4 extremities w/o difficulty. Affect pleasant.  ECG: Sinus tachycardia at a rate of 120 E p.m., normal axis, RSR prime in V1, left atrial enlargement, no ST-T changes or ST elevation.  Results for orders placed or performed during the hospital encounter of 01/04/17 (from the past 24 hour(s))  CBC  with Differential/Platelet     Status: Abnormal  Collection Time: 01/04/17  6:39 PM  Result Value Ref Range   WBC 12.1 (H) 4.0 - 10.5 K/uL   RBC 5.16 4.22 - 5.81 MIL/uL   Hemoglobin 15.5 13.0 - 17.0 g/dL   HCT 44.0 39.0 - 52.0 %   MCV 85.3 78.0 - 100.0 fL   MCH 30.0 26.0 - 34.0 pg   MCHC 35.2 30.0 - 36.0 g/dL   RDW 11.8 11.5 - 15.5 %   Platelets 508 (H) 150 - 400 K/uL   Neutrophils Relative % 87 %   Neutro Abs 10.4 (H) 1.7 - 7.7 K/uL   Lymphocytes Relative 10 %   Lymphs Abs 1.2 0.7 - 4.0 K/uL   Monocytes Relative 3 %   Monocytes Absolute 0.4 0.1 - 1.0 K/uL   Eosinophils Relative 0 %   Eosinophils Absolute 0.0 0.0 - 0.7 K/uL   Basophils Relative 0 %   Basophils Absolute 0.0 0.0 - 0.1 K/uL  D-dimer, quantitative (not at [facility])     Status: None   Collection Time: 01/04/17  6:39 PM  Result Value Ref Range   D-Dimer, Quant <0.27 0.00 - 0.50 ug/mL-FEU  Troponin I     Status: None   Collection Time: 01/04/17  6:39 PM  Result Value Ref Range   Troponin I <0.03 <0.03 ng/mL  Magnesium     Status: None   Collection Time: 01/04/17  6:39 PM  Result Value Ref Range   Magnesium 1.8 1.7 - 2.4 mg/dL  Sedimentation rate     Status: Abnormal   Collection Time: 01/04/17  6:39 PM  Result Value Ref Range   Sed Rate 18 (H) 0 - 16 mm/hr  I-stat chem 8, ed     Status: Abnormal   Collection Time: 01/04/17  7:03 PM  Result Value Ref Range   Sodium 142 135 - 145 mmol/L   Potassium 3.2 (L) 3.5 - 5.1 mmol/L   Chloride 106 101 - 111 mmol/L   BUN 10 6 - 20 mg/dL   Creatinine, Ser 0.60 (L) 0.61 - 1.24 mg/dL   Glucose, Bld 120 (H) 65 - 99 mg/dL   Calcium, Ion 1.14 (L) 1.15 - 1.40 mmol/L   TCO2 23 0 - 100 mmol/L   Hemoglobin 15.3 13.0 - 17.0 g/dL   HCT 45.0 39.0 - 52.0 %  TSH     Status: None   Collection Time: 01/04/17  8:49 PM  Result Value Ref Range   TSH 0.533 0.350 - 4.500 uIU/mL  Rapid urine drug screen (hospital performed)     Status: None   Collection Time: 01/04/17 10:12 PM  Result  Value Ref Range   Opiates NONE DETECTED NONE DETECTED   Cocaine NONE DETECTED NONE DETECTED   Benzodiazepines NONE DETECTED NONE DETECTED   Amphetamines NONE DETECTED NONE DETECTED   Tetrahydrocannabinol NONE DETECTED NONE DETECTED   Barbiturates NONE DETECTED NONE DETECTED   Dg Chest 2 View  Result Date: 01/04/2017 CLINICAL DATA:  cough/cp EXAM: CHEST  2 VIEW COMPARISON:  06/17/2008. FINDINGS: Pectus deformity noted. Mediastinum and hilar structures normal. Lungs are clear. Heart size normal. No pleural effusion or pneumothorax. No acute bony abnormality. IMPRESSION: No acute cardiopulmonary disease. Electronically Signed   By: Marcello Moores  Register   On: 01/04/2017 15:21   Ct Angio Chest Pe W And/or Wo Contrast  Result Date: 01/04/2017 CLINICAL DATA:  Acute onset of generalized chest pain. Initial encounter. EXAM: CT ANGIOGRAPHY CHEST WITH CONTRAST TECHNIQUE: Multidetector CT imaging of the chest was performed using the  standard protocol during bolus administration of intravenous contrast. Multiplanar CT image reconstructions and MIPs were obtained to evaluate the vascular anatomy. CONTRAST:  80 mL of Isovue 370 IV contrast COMPARISON:  Chest radiograph performed earlier today at 2:57 p.m. FINDINGS: Cardiovascular:  There is no evidence of pulmonary embolus. The heart is normal in size. The thoracic aorta is unremarkable. No calcific atherosclerotic disease is seen. The great vessels are within normal limits. Mediastinum/Nodes: The mediastinum is unremarkable in appearance. No mediastinal lymphadenopathy is seen. No pericardial effusion is identified. The visualized portions of the thyroid gland are unremarkable. No axillary lymphadenopathy is seen. Lungs/Pleura: The lungs are clear bilaterally. No focal consolidation, pleural effusion or pneumothorax seen. No masses are identified. Upper Abdomen: The visualized portions of the liver and spleen are unremarkable. The visualized portions of the gallbladder,  pancreas, adrenal glands and kidneys are within normal limits. 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 Musculoskeletal: No acute osseous abnormalities are identified. The visualized musculature is unremarkable in appearance. Review of the MIP images confirms the above findings. IMPRESSION: 1. No evidence of pulmonary embolus. 2. Lungs clear bilaterally. 3. Focal relatively severe luminal narrowing at the proximal celiac trunk. 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 Would correlate for any associated symptoms (abdominal pain or weight loss). No significant collateral vessels are yet seen; this is often asymptomatic until around the age of 79. If the patient is symptomatic, surgical consultation would be helpful. Electronically Signed   By: Garald Balding M.D.   On: 01/04/2017 21:35   ASSESSMENT: Douglas Bailey is a 26 year old male with a complex history of psychiatric disease including anxiety, depression, and bipolar disorder with variable compliance with his medications who presents with several days of chest pain following a upper respiratory tract infection which improves upon laying forward and a prominent pericardial friction rub over the lower left sternal border, most consistent with a clinical diagnosis of acute pericarditis. I've asked that he be observed overnight given his sinus tachycardia. Other causes of sinus tachycardia need to be ruled out.  PLAN/DISCUSSION: #) Acute pericarditis - Troponins negative, no prior history of pericarditis Dx: - CBC, Chem 7, TSH, ESR, CRP - TTE r/o pericardial effusion Tx: - Iburpofen, Cochicine  #) Sinus tachycardia - most likely secondary to the above, however other causes of sinus tachycardia need to be ruled out. - CBC, Chem 7, TSH, UTox - Address anxiety  Allyson Sabal, MD

## 2017-01-05 NOTE — Progress Notes (Signed)
  Echocardiogram 2D Echocardiogram has been performed.  Leta JunglingCooper, Marjory Meints M 01/05/2017, 11:51 AM

## 2017-01-05 NOTE — Progress Notes (Signed)
Dr. Craige CottaKirby paged to notify patient very anxious and needing antianxiety medication; normally takes Klonopin prn at home. Pt also requesting cough syrup medication. Awaiting response.

## 2017-01-05 NOTE — Discharge Instructions (Signed)
Continue taking Ibuprofen for a total of 3 wks. If it causes Nausea or heartburn- start taking Pepcid or Zantac over the counter.  Continue Colchicine until told to stop by your cardiologist  Please take all your medications with you for your next visit with your Primary MD. Please request your Primary MD to go over all hospital test results at the follow up.  Please ask your Primary MD to get all Hospital records sent to his/her office.  If you experience worsening of your admission symptoms, develop shortness of breath, chest pain, suicidal or homicidal thoughts or a life threatening emergency, you must seek medical attention immediately by calling 911 or calling your MD.  Bonita QuinYou must read the complete instructions/literature along with all the possible adverse reactions/side effects for all the medicines you take including new medications that have been prescribed to you. Take new medicines after you have completely understood and accpet all the possible adverse reactions/side effects.   Do not drive when taking pain medications or sedatives.    Do not take more than prescribed Pain, Sleep and Anxiety Medications  If you have smoked or chewed Tobacco in the last 2 yrs please stop. Stop any regular alcohol and or recreational drug use.  Wear Seat belts while driving.

## 2017-01-09 NOTE — Progress Notes (Signed)
Received call post discharge on 01/09/17 from pt's mother- Douglas Bailey-- [phone]) regarding pt's discharge medications- apparently pt had prescription for cochicine 0.6 mg tablets sent to [facility]- however this medication needed pre-auth per Medicaid- per medicaid preferred list- cochicine capsules are preferred- tablets are non-preferred- call made to [facility]- confirmed that the tablets needed pre-auth- Medicaid pre auth # to call- [phone]- paged Dr. Butler Denmarkizwan who was discharging MD- discussed problem with her- plan is to call in new prescription to [facility] for cochicine capsules- called pt's mother back to let her know to check in with [facility] later this afternoon to see if new script ready.

## 2017-01-19 ENCOUNTER — Ambulatory Visit (INDEPENDENT_AMBULATORY_CARE_PROVIDER_SITE_OTHER): Payer: Medicaid Other | Admitting: Physician Assistant

## 2017-01-19 ENCOUNTER — Encounter: Payer: Self-pay | Admitting: Physician Assistant

## 2017-01-19 VITALS — BP 142/100 | HR 123 | Ht 69.0 in | Wt 123.0 lb

## 2017-01-19 DIAGNOSIS — R079 Chest pain, unspecified: Secondary | ICD-10-CM

## 2017-01-19 DIAGNOSIS — I301 Infective pericarditis: Secondary | ICD-10-CM

## 2017-01-19 NOTE — Patient Instructions (Addendum)
Medication Instructions:  CONTINUE ibuprofen until course is completed.   OK TO STOP colchicine when you have been symptom free for 2 weeks or more  Labwork: NONE  Testing/Procedures: NONE  Follow-Up: Your physician wants you to follow-up in: 1 year with Dr. Antoine PocheHochrein or Theodore Demarkhonda Barrett PA You will receive a reminder letter in the mail two months in advance. If you don't receive a letter, please call our office to schedule the follow-up appointment.   Any Other Special Instructions Will Be Listed Below (If Applicable).  Continue to avoid alcohol, tobacco, and drugs.  Call our office if you begin having symptoms again.    If you need a refill on your cardiac medications before your next appointment, please call your pharmacy.

## 2017-01-19 NOTE — Progress Notes (Signed)
Cardiology Office Note   Date:  01/19/2017   ID:  Douglas Bailey, DOB 1991/04/30, MRN [id number]  PCP:  No PCP Per Patient  Cardiologist:  Dr Luna Kitchens, PA-C   Chief Complaint  Patient presents with  . Follow-up    2 weeks;  . Chest Pain    pressure on chest when breathing to deeply.    History of Present Illness: Douglas Bailey is a 26 y.o. male with a history of ADHD, Bipolar, ureteral stent for nephrolithiasis  Admit 01/10-01/10/2017 for pericarditis, patient given ibuprofen and colchicine  Douglas Bailey presents for post-hospital follow up.  Since d/c, Douglas Bailey has done well. Douglas Bailey chest pain has improved. Douglas Bailey only gets CP when Douglas Bailey coughs or takes a deep breath. Douglas Bailey is compliant with Douglas Bailey medications.   Douglas Bailey has not had any acute illnesses. Douglas Bailey may have had a cold before Douglas Bailey developed pericarditis but Douglas Bailey is not sure.  Douglas Bailey is scared that the pericarditis will kill him. Douglas Bailey and Douglas Bailey girlfriend live together. They have been eating poorly because either one of them had a job. However, the child prospect is improving. She has started working, just has not gotten a Product manager. She notes that they have problems sometimes eating of food banks because they're both vegetarian. However, in general, she feels things are getting better.  Douglas Bailey has been having problems with anxiety. Douglas Bailey is no longer smoking. They deny alcohol or drug abuse.   Past Medical History:  Diagnosis Date  . ADHD   . Anxiety   . Bipolar disorder ([facility])   . Depression   . Dysuria   . Hematuria   . History of kidney stones 2015  . Nausea   . Retained ureteral stent 12/21    Past Surgical History:  Procedure Laterality Date  . CYSTOSCOPY W/ URETERAL STENT PLACEMENT Left 12/15/14  . CYSTOSCOPY W/ URETERAL STENT REMOVAL Left 12/22/2014   Procedure: CYSTOSCOPY WITH LEFT STENT REMOVAL;  Surgeon: Sebastian Ache, MD;  Location: [facility];  Service: Urology;  Laterality: Left;  . CYSTOSCOPY WITH RETROGRADE  PYELOGRAM, URETEROSCOPY AND STENT PLACEMENT Left 12/15/2014   Procedure: CYSTOSCOPY STENT PLACEMENT left ureter;  Surgeon: Anner Crete, MD;  Location: [facility];  Service: Urology;  Laterality: Left;  . MOUTH SURGERY Bilateral 2013    Current Outpatient Prescriptions  Medication Sig Dispense Refill  . ARIPiprazole (ABILIFY) 5 MG tablet Take 5 mg by mouth at bedtime.    . citalopram (CELEXA) 20 MG tablet Take 30 mg by mouth daily.    . clonazePAM (KLONOPIN) 0.5 MG tablet Take 0.5 mg by mouth 2 (two) times daily as needed for anxiety.    . colchicine 0.6 MG tablet Take 1 tablet (0.6 mg total) by mouth daily. 90 tablet 0  . dexmethylphenidate (FOCALIN XR) 10 MG 24 hr capsule Take 10 mg by mouth every morning.    Marland Kitchen ibuprofen (ADVIL,MOTRIN) 600 MG tablet Take 1 tablet (600 mg total) by mouth every 8 (eight) hours. 63 tablet 0  . lamoTRIgine (LAMICTAL) 100 MG tablet Take 100 mg by mouth at bedtime.     No current facility-administered medications for this visit.     Allergies:   Patient has no known allergies.    Social History:  The patient  reports that Douglas Bailey has never smoked. Douglas Bailey has never used smokeless tobacco. Douglas Bailey reports that Douglas Bailey does not drink alcohol or use drugs.   Family History:  The patient's family history is not  on file.    ROS:  Please see the history of present illness. All other systems are reviewed and negative.    PHYSICAL EXAM: VS:  BP (!) 142/100   Pulse (!) 123   Ht 5\' 9"  (1.753 m)   Wt 123 lb (55.8 kg)   BMI 18.16 kg/m  , BMI Body mass index is 18.16 kg/m. GEN: Well nourished, Slender, male in no acute distress  HEENT: normal for age  Neck: no JVD, no carotid bruit, no masses Cardiac: RRR; no murmur, no rubs, or gallops Respiratory:  clear to auscultation bilaterally, normal work of breathing GI: soft, nontender, nondistended, + BS MS: no deformity or atrophy; no edema; distal pulses are 2+ in all 4 extremities   Skin: warm and dry, no rash Neuro:  Strength and  sensation are intact Psych: euthymic mood, full affect   EKG:  EKG is ordered today. The ekg ordered today demonstrates sinus rhythm, heart rate 91. Initial heart rate on auscultation was 123, but was regular.  ECHO: 01/05/2017 - Left ventricle: The cavity size was normal. Systolic function was   normal. The estimated ejection fraction was in the range of 55%   to 60%. Wall motion was normal; there were no regional wall   motion abnormalities. - Right ventricle: Not able to assess RV size due to poor   visualization. - Right atrium: Poorly visualized. - Pericardium:  There was no pericardial effusion.  Recent Labs: 01/04/2017: Magnesium 1.8 01/05/2017: BUN 9; Creatinine, Ser 0.49; Hemoglobin 14.0; Platelets 430; Potassium 3.8; Sodium 142; TSH 1.144    Lipid Panel No results found for: CHOL, TRIG, HDL, CHOLHDL, VLDL, LDLCALC, LDLDIRECT   Wt Readings from Last 3 Encounters:  01/19/17 123 lb (55.8 kg)  01/05/17 123 lb 14.4 oz (56.2 kg)  12/28/16 115 lb (52.2 kg)     Other studies Reviewed: Additional studies/ records that were reviewed today include: Hospital records and testing.  ASSESSMENT AND PLAN:  1.  Pericarditis: Douglas Bailey is still having some symptoms. Douglas Bailey is to complete Douglas Bailey course of ibuprofen. Douglas Bailey is to continue the colchicine for now. Once Douglas Bailey has been completely asymptomatic for 2 weeks or more, Douglas Bailey can stop the colchicine. Douglas Bailey understands the importance of contacting us get additional symptoms. If Douglas Bailey has no additional symptoms or concerns, Douglas Bailey can follow-up in the year.  I spent a great deal of time reassuring Douglas Bailey and Douglas Bailey girlfriend that the pericarditis was not going to kill him.  2. ADHD and bipolar disorder: Douglas Bailey has been feeling very anxious, Douglas Bailey blood pressure and heart rate are elevated in the office today, and I'm concerned that Douglas Bailey psychiatric medications need adjusting. Douglas Bailey is encouraged to follow-up with the physician who prescribes these medications for medication  adjustments.  Current medicines are reviewed at length with the patient today.  The patient has concerns regarding medicines. Concerns were addressed  The following changes have been made:  no change  Labs/ tests ordered today include:  No orders of the defined types were placed in this encounter.    Disposition:   FU with Dr. Antoine PocheHochrein or myself in the year.  Tawny AsalSigned, Liya Strollo, PA-C  01/19/2017 10:24 AM    [facility] Phone: [phone]; Fax: [phone]  This note was written with the assistance of speech recognition software. Please excuse any transcriptional errors.

## 2019-06-26 ENCOUNTER — Telehealth: Payer: Self-pay | Admitting: *Deleted

## 2019-06-26 NOTE — Telephone Encounter (Signed)
Unable to leave a message, no voicemail.  

## 2019-07-31 ENCOUNTER — Other Ambulatory Visit: Payer: Self-pay

## 2019-07-31 ENCOUNTER — Emergency Department (HOSPITAL_COMMUNITY)
Admission: EM | Admit: 2019-07-31 | Discharge: 2019-07-31 | Disposition: A | Discharge status: Home / Self Care | Payer: Medicaid Other | Attending: Emergency Medicine | Admitting: Emergency Medicine

## 2019-07-31 ENCOUNTER — Emergency Department (HOSPITAL_COMMUNITY): Payer: Medicaid Other

## 2019-07-31 ENCOUNTER — Encounter (HOSPITAL_COMMUNITY): Payer: Self-pay

## 2019-07-31 DIAGNOSIS — F419 Anxiety disorder, unspecified: Secondary | ICD-10-CM

## 2019-07-31 DIAGNOSIS — F410 Panic disorder [episodic paroxysmal anxiety] without agoraphobia: Secondary | ICD-10-CM | POA: Diagnosis present

## 2019-07-31 DIAGNOSIS — R200 Anesthesia of skin: Secondary | ICD-10-CM | POA: Insufficient documentation

## 2019-07-31 DIAGNOSIS — G479 Sleep disorder, unspecified: Secondary | ICD-10-CM | POA: Diagnosis not present

## 2019-07-31 DIAGNOSIS — R55 Syncope and collapse: Secondary | ICD-10-CM | POA: Insufficient documentation

## 2019-07-31 LAB — BASIC METABOLIC PANEL
Anion gap: 8 (ref 5–15)
BUN: 10 mg/dL (ref 6–20)
CO2: 25 mmol/L (ref 22–32)
Calcium: 9.7 mg/dL (ref 8.9–10.3)
Chloride: 106 mmol/L (ref 98–111)
Creatinine, Ser: 0.66 mg/dL (ref 0.61–1.24)
GFR calc Af Amer: >60 mL/min (ref >60)
GFR calc non Af Amer: >60 mL/min (ref >60)
Glucose, Bld: 108 mg/dL — ABNORMAL HIGH (ref 70–99)
Potassium: 4.1 mmol/L (ref 3.5–5.1)
Sodium: 139 mmol/L (ref 135–145)

## 2019-07-31 LAB — CBC WITH DIFFERENTIAL/PLATELET
Abs Immature Granulocytes: 0.02 10*3/uL (ref 0.00–0.07)
Basophils Absolute: 0 10*3/uL (ref 0.0–0.1)
Basophils Relative: 1 %
Eosinophils Absolute: 0.1 10*3/uL (ref 0.0–0.5)
Eosinophils Relative: 1 %
HCT: 49.2 % (ref 39.0–52.0)
Hemoglobin: 16.4 g/dL (ref 13.0–17.0)
Immature Granulocytes: 0 %
Lymphocytes Relative: 9 %
Lymphs Abs: 0.7 10*3/uL (ref 0.7–4.0)
MCH: 30 pg (ref 26.0–34.0)
MCHC: 33.3 g/dL (ref 30.0–36.0)
MCV: 90.1 fL (ref 80.0–100.0)
Monocytes Absolute: 0.4 10*3/uL (ref 0.1–1.0)
Monocytes Relative: 5 %
Neutro Abs: 6 10*3/uL (ref 1.7–7.7)
Neutrophils Relative %: 84 %
Platelets: 325 10*3/uL (ref 150–400)
RBC: 5.46 MIL/uL (ref 4.22–5.81)
RDW: 12.1 % (ref 11.5–15.5)
WBC: 7.1 10*3/uL (ref 4.0–10.5)
nRBC: 0 % (ref 0.0–0.2)

## 2019-07-31 MED ORDER — HYDROXYZINE HCL 25 MG PO TABS
25.0000 mg | ORAL_TABLET | Freq: Once | ORAL | Status: AC
  Administered 2019-07-31: 25 mg via ORAL
  Filled 2019-07-31: qty 1

## 2019-07-31 MED ORDER — HYDROXYZINE HCL 25 MG PO TABS: 25.0000 mg | ORAL_TABLET | Freq: Three times a day (TID) | ORAL | 0 refills | Status: AC | PRN

## 2019-07-31 NOTE — ED Provider Notes (Signed)
[facility] EMERGENCY [facility]PARTMENT Provider Note   CSN: [id number] Arrival date & time: 07/31/19  0745    History   Chief Complaint Chief Complaint  Patient presents with  . Panic Attack    HPI Douglas Bailey is a 28 y.o. male with history of ADHD, anxiety, bipolar disorder, depression, nephrolithiasis presents for evaluation with concern that he may have had a TIA.  He tells me that at 1 AM as he was falling asleep "getting into REM sleep", he began to feel as though his muscles were paralyzed and chest was tight.  He then reports that he thinks he may have lost consciousness for about 3 seconds.  He reports the entire episode lasted around 7 seconds total and when he regained consciousness he began to feel very anxious.  He tells me that he has a severe anxiety disorder, does not currently take any medications for this but is in the process of setting up an appointment with his therapist and psychiatrist to restart medications.  He tells me initially he felt as though he had some transient blurred vision of the eyes bilaterally as well as numbness around the right side of the lip and right forearm but this is improving.  He does report a very mild generalized headache but states "nothing to write home about".  He denies any weakness to the extremities.  He reports that in general he is feeling much better although is very anxious to fall back asleep again which is what brought him to the emergency department today.  He does note that more recently he has been under more stress and thinks this could be contributing to his symptoms.  No known family history of TIA/CVA at a young age, though family history is limited as he is adopted.  He is a non-smoker, denies recreational drug use, does not drink alcohol daily or excessively.     The history is provided by the patient.    Past Medical History:  Diagnosis Date  . ADHD   . Anxiety   . Bipolar disorder (HCC)   . Depression    . Dysuria   . Hematuria   . History of kidney stones 2015  . Nausea   . 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 Acute viral pericarditis 01/19/2017  . Hypokalemia 01/05/2017  . Hyperglycemia 01/05/2017  . Bipolar disorder (HCC) 01/05/2017  . 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 Acute pericarditis 01/04/2017    Past Surgical History:  Procedure Laterality Date  . CYSTOSCOPY W/ URETERAL STENT PLACEMENT Left 12/15/14  . CYSTOSCOPY W/ URETERAL STENT REMOVAL Left 12/22/2014   Procedure: CYSTOSCOPY WITH LEFT STENT REMOVAL;  Surgeon: Sebastian Acheheodore Manny, MD;  [facility];  Service: Urology;  Laterality: Left;  . CYSTOSCOPY WITH RETROGRADE PYELOGRAM, URETEROSCOPY AND STENT PLACEMENT Left 12/15/2014   Procedure: CYSTOSCOPY STENT PLACEMENT left ureter;  Surgeon: Anner CreteJohn J Wrenn, MD;  Location: [facility];  Service: Urology;  Laterality: Left;  . MOUTH SURGERY Bilateral 2013        Home Medications    Prior to Admission medications   Medication Sig Start Date End Date Taking? Authorizing Provider  hydrOXYzine (ATARAX/VISTARIL) 25 MG tablet Take 1 tablet (25 mg total) by mouth every 8 (eight) hours as needed for anxiety. 07/31/19   Jeanie SewerFawze, Rodrigues Urbanek A, PA-C    Family History History reviewed. No pertinent family history.  Social History Social History  Tobacco Use  . Smoking status: Never Smoker  . Smokeless tobacco: Never Used  Substance Use Topics  . Alcohol use: No  . Drug use: No     Allergies   Patient has no known allergies.   Review of Systems Review of Systems  Constitutional: Negative for chills and fever.  Eyes: Positive for visual disturbance (resolved).  Respiratory: Positive for chest tightness (resolved). Negative for shortness of breath.   Cardiovascular: Negative for chest pain.  Gastrointestinal: Negative for abdominal pain, nausea and vomiting.  Neurological: Positive for syncope  (?possible), numbness (improving) and headaches.  Psychiatric/Behavioral: The patient is nervous/anxious.   All other systems reviewed and are negative.    Physical Exam Updated Vital Signs BP (!) 120/94 (BP Location: Right Arm)   Pulse 92   Temp 98.9 F (37.2 C) (Oral)   Resp 14   SpO2 97%   Physical Exam Vitals signs and nursing note reviewed.  Constitutional:      General: He is not in acute distress.    Appearance: He is well-developed.  HENT:     Head: Normocephalic and atraumatic.  Eyes:     General:        Right eye: No discharge.        Left eye: No discharge.     Conjunctiva/sclera: Conjunctivae normal.  Neck:     Vascular: No JVD.     Trachea: No tracheal deviation.  Cardiovascular:     Rate and Rhythm: Normal rate and regular rhythm.     Pulses: Normal pulses.     Heart sounds: Normal heart sounds.  Pulmonary:     Effort: Pulmonary effort is normal. No respiratory distress.     Breath sounds: Normal breath sounds.     Comments: Speaking in full sentences without difficulty. Abdominal:     General: Bowel sounds are normal. There is no distension.     Palpations: Abdomen is soft.     Tenderness: There is no abdominal tenderness. There is no guarding or rebound.  Skin:    General: Skin is warm and dry.     Findings: No erythema.  Neurological:     General: No focal deficit present.     Mental Status: He is alert and oriented to person, place, and time.     Cranial Nerves: No cranial nerve deficit.     Sensory: No sensory deficit.     Motor: No weakness.     Coordination: Coordination normal.     Gait: Gait normal.     Comments: Mental Status:  Alert, thought content appropriate, able to give a coherent history. Speech fluent without evidence of aphasia. Able to follow 2 step commands without difficulty.  Cranial Nerves:  II:  Peripheral visual fields grossly normal, pupils equal, round, reactive to light III,IV, VI: ptosis not present, extra-ocular  motions intact bilaterally  V,VII: smile symmetric, facial light touch sensation equal VIII: hearing grossly normal to voice  X: uvula elevates symmetrically  XI: bilateral shoulder shrug symmetric and strong XII: midline tongue extension without fassiculations Motor:  Normal tone. 5/5 strength of BUE and BLE major muscle groups including strong and equal grip strength and dorsiflexion/plantar flexion, no pronator drift Sensory: light touch normal in all extremities. Cerebellar: normal finger-to-nose with bilateral upper extremities, Romberg sign absent Gait: normal gait and balance. Able to walk on toes and heels with ease.  CV: 2+ radial and DP/PT pulses  Psychiatric:        Mood and Affect: Mood  is anxious.        Speech: Speech normal.        Behavior: Behavior is withdrawn. Behavior is cooperative.      ED Treatments / Results  Labs (all labs ordered are listed, but only abnormal results are displayed) Labs Reviewed  BASIC METABOLIC PANEL - Abnormal; Notable for the following components:      Result Value   Glucose, Bld 108 (*)    All other components within normal limits  CBC WITH DIFFERENTIAL/PLATELET    EKG None  Radiology Ct Head Wo Contrast  Result Date: 07/31/2019 CLINICAL DATA:  TIA, upper extremity tingling, anxiety EXAM: CT HEAD WITHOUT CONTRAST TECHNIQUE: Contiguous axial images were obtained from the base of the skull through the vertex without intravenous contrast. COMPARISON:  09/01/2008 FINDINGS: Brain: No evidence of acute infarction, hemorrhage, hydrocephalus, extra-axial collection or mass lesion/mass effect. Vascular: No hyperdense vessel or unexpected calcification. Skull: Normal. Negative for fracture or focal lesion. Sinuses/Orbits: No acute finding. Other: None. IMPRESSION: No acute intracranial findings. Electronically Signed   By: Duanne GuessNicholas  Plundo M.D.   On: 07/31/2019 09:35    Procedures Procedures (including critical care time)  Medications  Ordered in ED Medications  hydrOXYzine (ATARAX/VISTARIL) tablet 25 mg (25 mg Oral Given 07/31/19 1012)     Initial Impression / Assessment and Plan / ED Course  I have reviewed the triage vital signs and the nursing notes.  Pertinent labs & imaging results that were available during my care of the patient were reviewed by me and considered in my medical decision making (see chart for details).        Patient presents for evaluation of severe anxiety after a sleep disturbance.  He is afebrile, initially tachycardic prior to my assessment but on my assessment and on subsequent reevaluation's his heart rate was within normal limits.  Vital signs otherwise stable.  He is nontoxic in appearance.  He has a normal neurologic examination with no focal deficits.  Ambulatory without difficulty.  His ABCD 2 score for TIA is 1 putting him in a low risk category and I highly doubt that he had a TIA.  His description of his symptoms are more consistent with myoclonic jerks versus lucid dreaming.  His lab work today reviewed by me shows no leukocytosis, no anemia, no metabolic derangements, no renal insufficiency.  Head CT shows no acute intracranial abnormalities with no evidence of mass, hemorrhage, skull fracture, or infarction.  On reevaluation he is resting comfortably in no apparent distress.  He was given hydroxyzine in the ED and reports that he is feeling much more relaxed and feels comfortable with discharge home.  He is establishing follow-up with his psychiatrist and therapist.  We will give a small amount of hydroxyzine to take as needed for anxiety in the meantime.  Discussed side effects.  Discussed strict ED return precautions.  Patient and his father verbalized understanding of and agreement with plan and patient stable for discharge home at this time.  Final Clinical Impressions(s) / ED Diagnoses   Final diagnoses:  Sleep disturbance  Anxiety    ED Discharge Orders         Ordered     hydrOXYzine (ATARAX/VISTARIL) 25 MG tablet  Every 8 hours PRN     07/31/19 1108           Jeanie SewerFawze, Amarii Amy A, PA-C 07/31/19 1111    Tegeler, Canary Brimhristopher J, MD 07/31/19 1558

## 2019-07-31 NOTE — ED Triage Notes (Signed)
Pt arrives POV from home with father c/o tingling in both arms and hands. Pt states he believes he had a stroke last night. Pt states "he just does not feel right". Pt states he has an anxiety d/o and that these sxs have happened before but "were not as intense."

## 2019-07-31 NOTE — Discharge Instructions (Signed)
Your work-up today was reassuring.  You can take hydroxyzine as needed for anxiety.  It can make you sleepy so be aware and do not drive, drink alcohol, or operate heavy machinery if this medicine causes side effects.  Follow-up with your primary care provider, psychiatrist, therapist for reevaluation of your symptoms.  Return to the emergency department if any concerning signs or symptoms develop such as weakness to one side of the body, difficulty breathing or swallowing, loss of consciousness, chest pain, shortness of breath.

## 2019-07-31 NOTE — ED Notes (Signed)
Patient verbalizes understanding of discharge instructions. Opportunity for questioning and answers were provided.  pt discharged from ED. ambulatory by self   

## 2019-12-17 IMAGING — CT CT HEAD WITHOUT CONTRAST
3 of 4 series · 13 of 47 positions shown, 15 images · non-contrast
Comparison: 09/01/2008

CLINICAL DATA: TIA, upper extremity tingling, anxiety

EXAM:
CT HEAD WITHOUT CONTRAST
TECHNIQUE: Contiguous axial images were obtained from the base of the skull
through the vertex without intravenous contrast.

[Series 3: head without · axial · non-contrast · 0.46mm/px · z∈[-126,-0]mm · 7 of 35 slices shown, 9 images]
[im 5/35  brain]
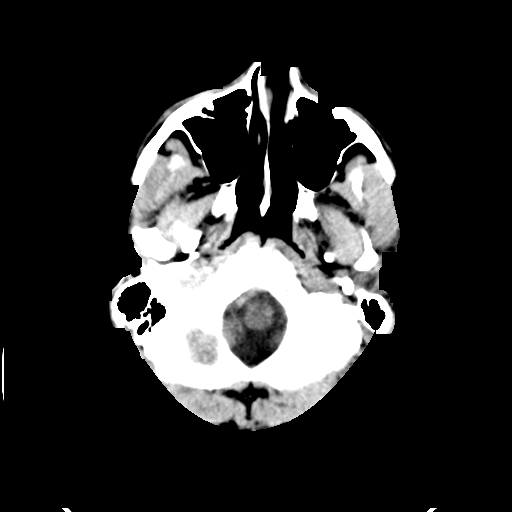
[im 5/35  bone]
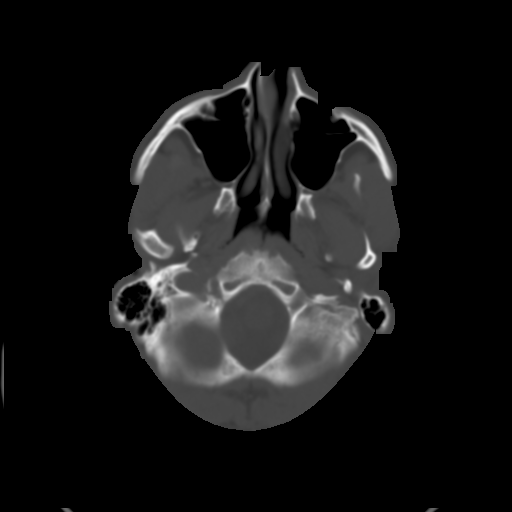
[im 9/35  brain]
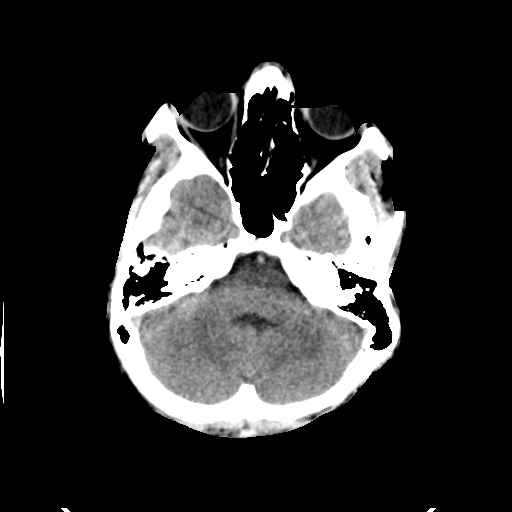
[im 13/35  brain]
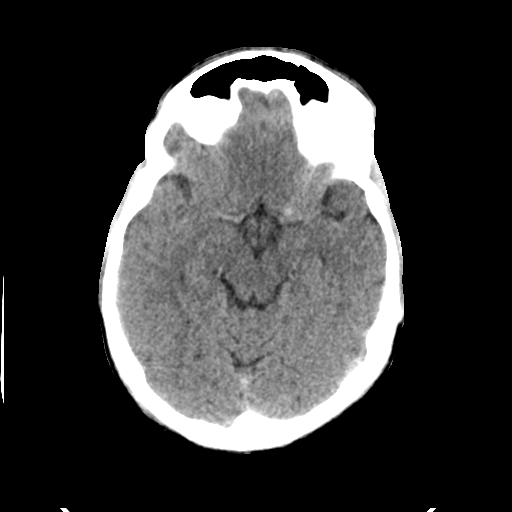
[im 18/35  brain]
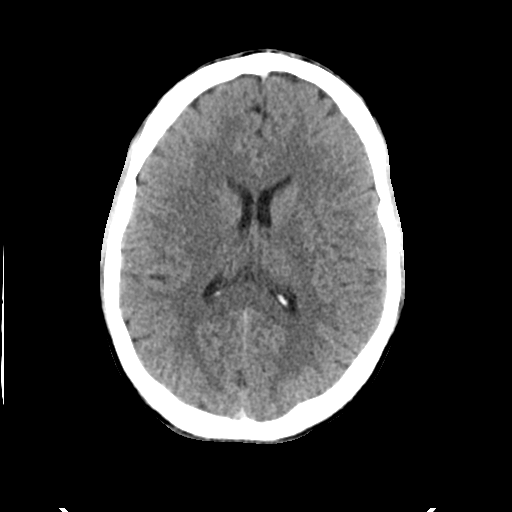
[im 22/35  brain]
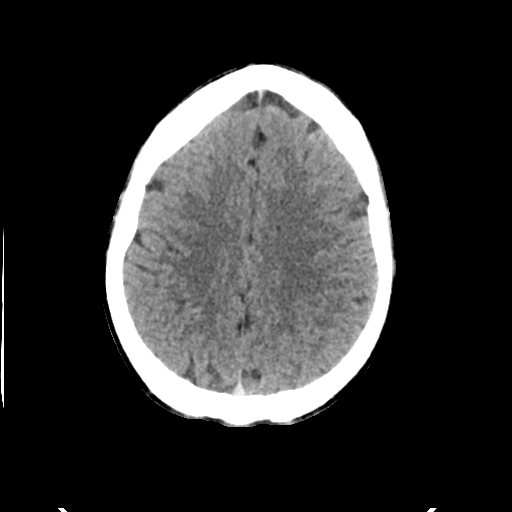
[im 22/35  bone]
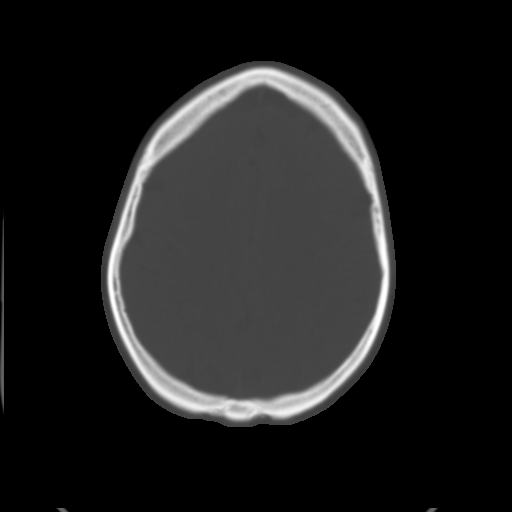
[im 26/35  brain]
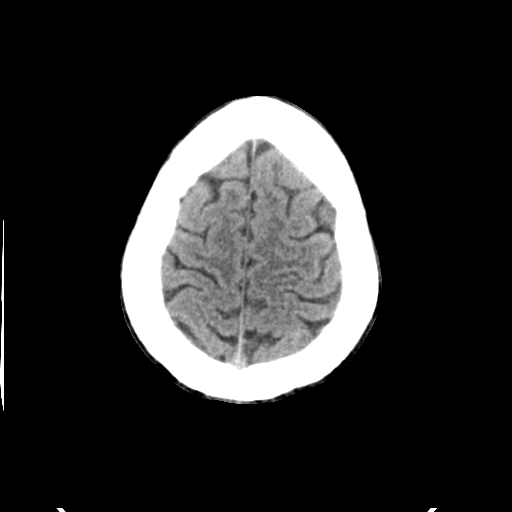
[im 30/35  brain]
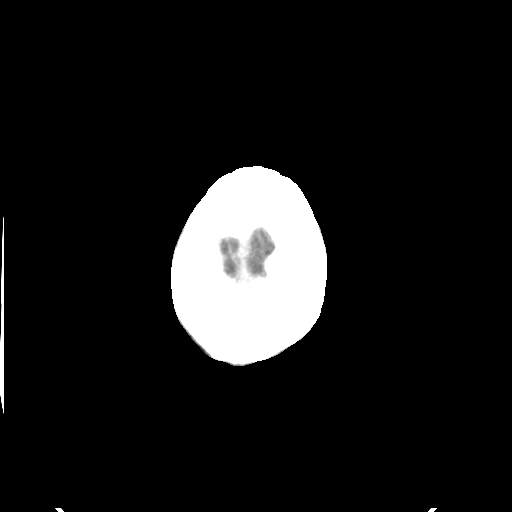

[Series 5: head without cor · coronal · non-contrast · 0.34mm/px · 3 of 73 slices shown]
[im 25/73  brain]
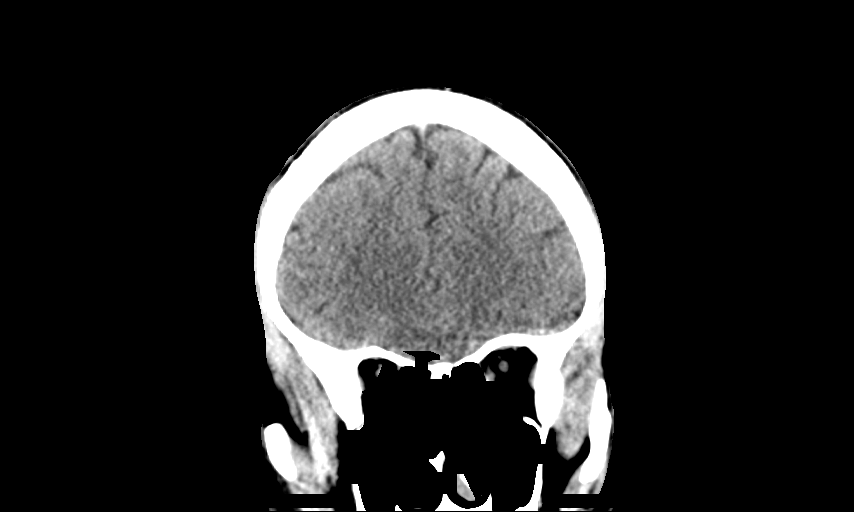
[im 33/73  brain]
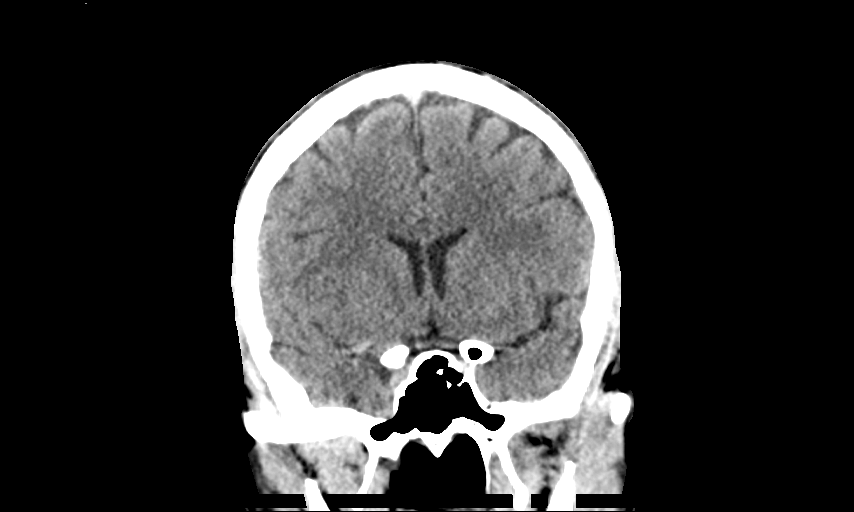
[im 41/73  brain]
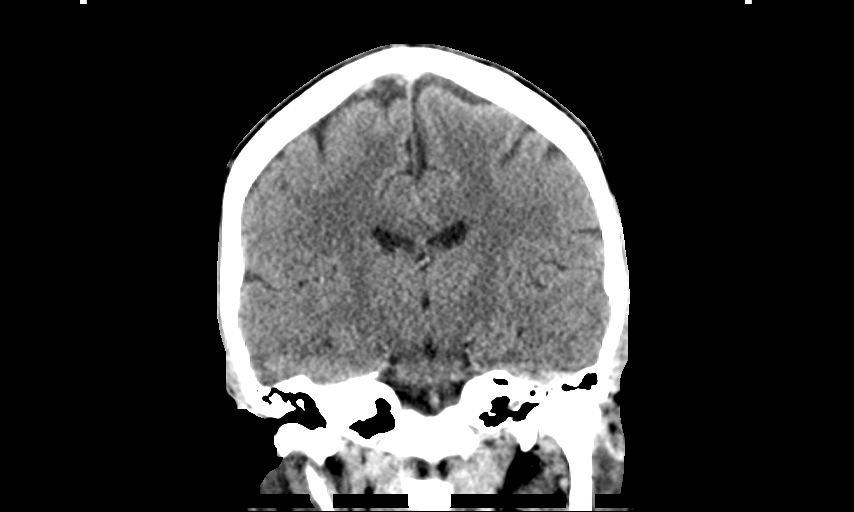

[Series 6: head without sag · sagittal · non-contrast · 0.34mm/px · 3 of 67 slices shown]
[im 23/67  brain]
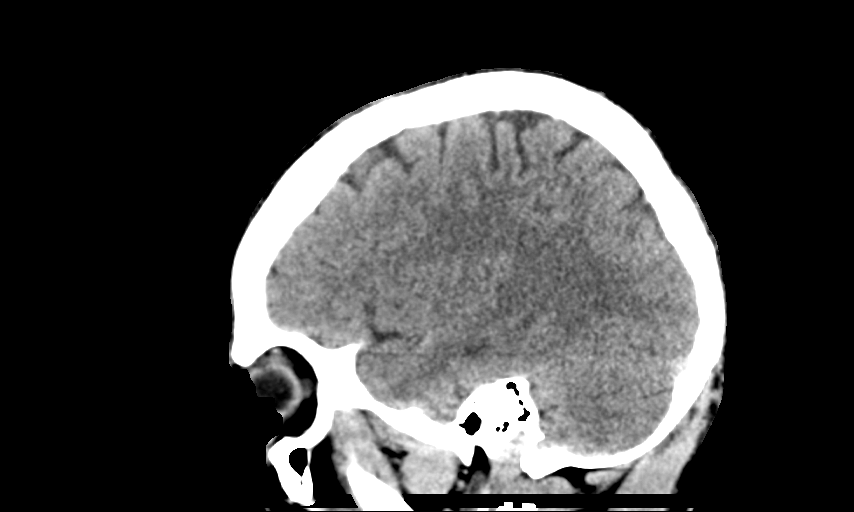
[im 34/67  brain]
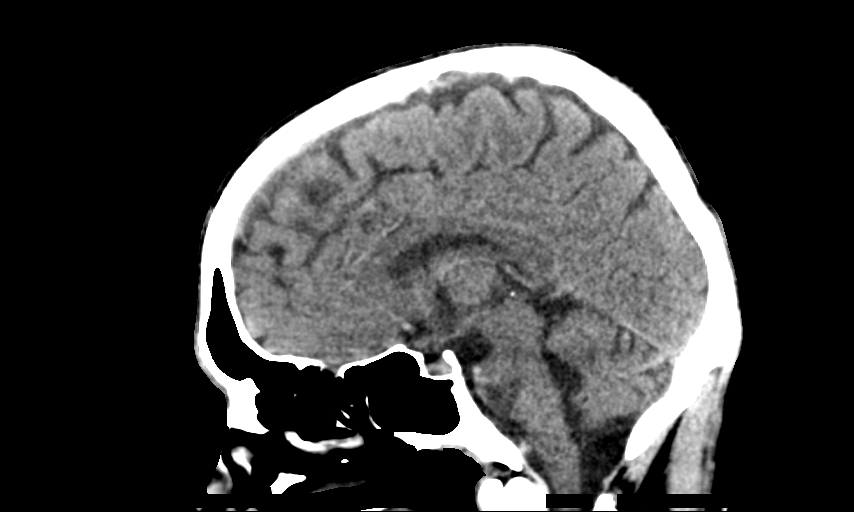
[im 45/67  brain]
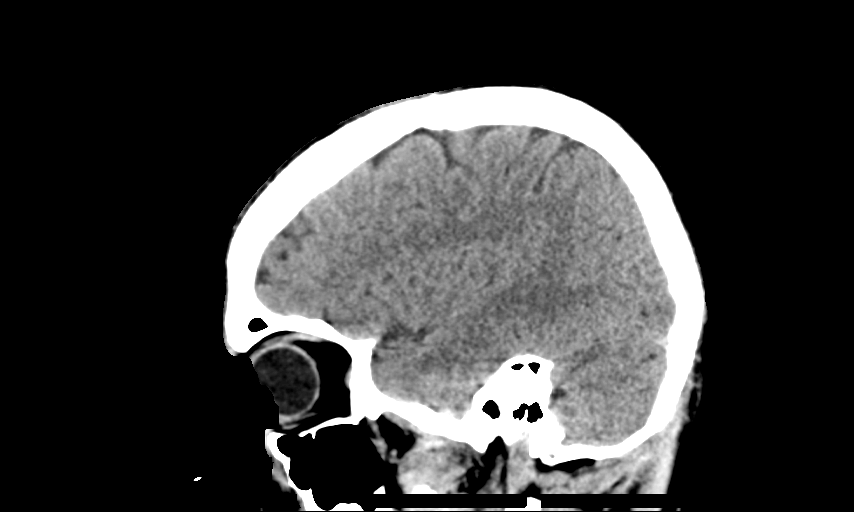

[13 of 47 positions shown; findings below may reference images not displayed]

FINDINGS: Brain: No evidence of acute infarction, hemorrhage, hydrocephalus,
extra-axial collection or mass lesion/mass effect.

Vascular: No hyperdense vessel or unexpected calcification.

Skull: Normal. Negative for fracture or focal lesion.

Sinuses/Orbits: No acute finding.

Other: None.
IMPRESSION: No acute intracranial findings.

## 2020-01-06 ENCOUNTER — Ambulatory Visit: Payer: Self-pay | Admitting: Cardiology

## 2020-01-06 NOTE — Progress Notes (Deleted)
Primary Physician/Referring:  Patient, No Pcp Per  Patient ID: Douglas Bailey, male    DOB: 1990-12-29, 29 y.o.   MRN: [id number]  No chief complaint on file.  HPI:    Douglas Bailey  is a 29 y.o. ADHD, anxiety, bipolar disorder, depression, nephrolithiasis referred to me for evaluation of heart murmur.    Past Medical History:  Diagnosis Date  . ADHD   . Anxiety   . Bipolar disorder ([place])   . Depression   . Dysuria   . Hematuria   . History of kidney stones 2015  . Nausea   . Retained ureteral stent 12/21   Past Surgical History:  Procedure Laterality Date  . CYSTOSCOPY W/ URETERAL STENT PLACEMENT Left 12/15/14  . CYSTOSCOPY W/ URETERAL STENT REMOVAL Left 12/22/2014   Procedure: CYSTOSCOPY WITH LEFT STENT REMOVAL;  Surgeon: Alexis Frock, MD;  Location: [facility];  Service: Urology;  Laterality: Left;  . CYSTOSCOPY WITH RETROGRADE PYELOGRAM, URETEROSCOPY AND STENT PLACEMENT Left 12/15/2014   Procedure: CYSTOSCOPY STENT PLACEMENT left ureter;  Surgeon: Malka So, MD;  Location: [facility];  Service: Urology;  Laterality: Left;  . MOUTH SURGERY Bilateral 2013   Social History   Tobacco Use  . Smoking status: Never Smoker  . Smokeless tobacco: Never Used  Substance Use Topics  . Alcohol use: No     ROS  ***ROS Objective  There were no vitals taken for this visit.  Vitals with BMI 07/31/2019 07/31/2019 07/31/2019  Height - - -  Weight - - -  BMI - - -  Systolic 237 628 315  Diastolic 89 94 93  Pulse 77 92 111     ***Physical Exam Laboratory examination:   Recent Labs    07/31/19 0850  NA 139  K 4.1  CL 106  CO2 25  GLUCOSE 108*  BUN 10  CREATININE 0.66  CALCIUM 9.7  GFRNONAA >60  GFRAA >60   CrCl cannot be calculated (Patient's most recent lab result is older than the maximum 21 days allowed.).  CMP Latest Ref Rng & Units 07/31/2019 01/05/2017 01/04/2017  Glucose 70 - 99 mg/dL 108(H) 124(H) 120(H)  BUN 6 - 20 mg/dL 10 9 10   Creatinine 0.61 -  1.24 mg/dL 0.66 0.49(L) 0.60(L)  Sodium 135 - 145 mmol/L 139 142 142  Potassium 3.5 - 5.1 mmol/L 4.1 3.8 3.2(L)  Chloride 98 - 111 mmol/L 106 108 106  CO2 22 - 32 mmol/L 25 27 -  Calcium 8.9 - 10.3 mg/dL 9.7 9.6 -   CBC Latest Ref Rng & Units 07/31/2019 01/05/2017 01/04/2017  WBC 4.0 - 10.5 K/uL 7.1 7.7 -  Hemoglobin 13.0 - 17.0 g/dL 16.4 14.0 15.3  Hematocrit 39.0 - 52.0 % 49.2 41.9 45.0  Platelets 150 - 400 K/uL 325 430(H) -   Lipid Panel  No results found for: CHOL, TRIG, HDL, CHOLHDL, VLDL, LDLCALC, LDLDIRECT HEMOGLOBIN A1C No results found for: HGBA1C, MPG TSH No results for input(s): TSH in the last 8760 hours.  *** Medications and allergies  No Known Allergies   Current Outpatient Medications  Medication Instructions  . hydrOXYzine (ATARAX/VISTARIL) 25 mg, Oral, Every 8 hours PRN    Radiology:  No results found.  Cardiac Studies:   Echocardiogram 01/05/2018:    - Left ventricle: The cavity size was normal. Systolic function was   normal. The estimated ejection fraction was in the range of 55%   to 60%. Wall motion was normal; there were no regional wall  motion abnormalities. - Right ventricle: Not able to assess RV size due to poor   visualization. - Right atrium: Poorly visualized.  Assessment  No diagnosis found.  ***  ***  Recommendations:  No orders of the defined types were placed in this encounter.   ***  Yates Decamp, MD, [facility] 01/06/2020, 1:08 PM [facility] [place] Office: [phone]

## 2020-03-24 ENCOUNTER — Ambulatory Visit: Payer: Self-pay | Admitting: Cardiology
